# Patient Record
Sex: Female | Born: 1955 | Race: Black or African American | Hispanic: No | Marital: Single | Smoking: Current every day smoker
Health system: Southern US, Community
[De-identification: ages and names within clinical notes are randomized; demographics above are authoritative.]

## PROBLEM LIST (undated history)

## (undated) DIAGNOSIS — I10 Essential (primary) hypertension: Secondary | ICD-10-CM

## (undated) DIAGNOSIS — E119 Type 2 diabetes mellitus without complications: Secondary | ICD-10-CM

## (undated) DIAGNOSIS — G459 Transient cerebral ischemic attack, unspecified: Secondary | ICD-10-CM

## (undated) DIAGNOSIS — M1990 Unspecified osteoarthritis, unspecified site: Secondary | ICD-10-CM

## (undated) DIAGNOSIS — E785 Hyperlipidemia, unspecified: Secondary | ICD-10-CM

## (undated) DIAGNOSIS — M5020 Other cervical disc displacement, unspecified cervical region: Secondary | ICD-10-CM

## (undated) HISTORY — PX: TUBAL LIGATION: SHX77

## (undated) HISTORY — DX: Essential (primary) hypertension: I10

---

## 1998-08-14 DIAGNOSIS — G459 Transient cerebral ischemic attack, unspecified: Secondary | ICD-10-CM

## 1998-08-14 HISTORY — DX: Transient cerebral ischemic attack, unspecified: G45.9

## 2006-07-08 ENCOUNTER — Emergency Department: Payer: Self-pay | Admitting: Emergency Medicine

## 2008-02-26 ENCOUNTER — Other Ambulatory Visit: Payer: Self-pay

## 2008-02-26 ENCOUNTER — Emergency Department: Payer: Self-pay | Admitting: Unknown Physician Specialty

## 2008-07-28 ENCOUNTER — Emergency Department: Payer: Self-pay | Admitting: Emergency Medicine

## 2009-06-22 ENCOUNTER — Ambulatory Visit: Payer: Self-pay | Admitting: Internal Medicine

## 2011-02-06 ENCOUNTER — Ambulatory Visit: Payer: Self-pay | Admitting: "Endocrinology

## 2011-04-18 ENCOUNTER — Ambulatory Visit: Payer: Self-pay

## 2011-07-05 ENCOUNTER — Ambulatory Visit: Payer: Self-pay

## 2011-09-21 ENCOUNTER — Ambulatory Visit (INDEPENDENT_AMBULATORY_CARE_PROVIDER_SITE_OTHER): Payer: Self-pay | Admitting: Family Medicine

## 2011-09-21 ENCOUNTER — Telehealth: Payer: Self-pay | Admitting: *Deleted

## 2011-09-21 ENCOUNTER — Encounter: Payer: Self-pay | Admitting: Family Medicine

## 2011-09-21 VITALS — BP 133/82 | HR 84 | Ht 64.0 in | Wt 172.0 lb

## 2011-09-21 DIAGNOSIS — H00019 Hordeolum externum unspecified eye, unspecified eyelid: Secondary | ICD-10-CM

## 2011-09-21 DIAGNOSIS — I10 Essential (primary) hypertension: Secondary | ICD-10-CM | POA: Insufficient documentation

## 2011-09-21 DIAGNOSIS — E785 Hyperlipidemia, unspecified: Secondary | ICD-10-CM | POA: Insufficient documentation

## 2011-09-21 DIAGNOSIS — M542 Cervicalgia: Secondary | ICD-10-CM

## 2011-09-21 DIAGNOSIS — H029 Unspecified disorder of eyelid: Secondary | ICD-10-CM | POA: Insufficient documentation

## 2011-09-21 DIAGNOSIS — G8929 Other chronic pain: Secondary | ICD-10-CM | POA: Insufficient documentation

## 2011-09-21 MED ORDER — CYCLOBENZAPRINE HCL 10 MG PO TABS: 10.0000 mg | ORAL_TABLET | Freq: Three times a day (TID) | ORAL | Status: DC | PRN

## 2011-09-21 MED ORDER — NAPROXEN 500 MG PO TABS: 500.0000 mg | ORAL_TABLET | Freq: Two times a day (BID) | ORAL | Status: DC

## 2011-09-21 NOTE — Assessment & Plan Note (Signed)
It is unclear exactly how long this has been going on or why she came to [facility] to have it evaluated; She seems to have seen multiple doctors regarding the issues and had imaging performed and subsequent recommendation was surgery; - obtain records from [facility] and Dr. Iantha Fallen in [place] - continue cyclobenzaprine and motrin regimen - given refills but patient states that she uses a free prescription program in [place] - no imaging at this time since the problem and chronic and stable

## 2011-09-21 NOTE — Progress Notes (Signed)
  Subjective:    Patient ID: Alyssa Black, female    DOB: 05-Dec-1955, 56 y.o.   MRN: [id number]  HPI Alyssa Black is a new patient who presents with chronic neck pain and bilateral nodule on her upper eyelids.   1. 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; however she states that she has also been told that she has arthritis of her neck; Also she had at least one doctor recommend surgery, but she was hesitant to do so; When asked why she came for evaluation here as opposed to one of the other physicians who has already evaluated her, she does not give a direct answer, but states that she does not have much money; currently she states that the pain is in her central neck and lower back; it is a 6/10 and reaches a 10/10 three times a week with increased activity; it is a throbbing pain; her treatment regimen is cyclobenzaprine 10mg  TID, Naproxen 500 mg BID, topical analgesic that is OTC; she also wears a back brace at time; I'm not sure who is prescribing these medications, but states that the medication help; no numbness or tingling in hands  2. Eyelid Lesions - bilateral, upper eyelid, noticed a few months ago, seen by another physician who said to put warm cloth on them, not painful, no drainage  Review of Systems  Constitutional: Negative for fever, activity change and unexpected weight change.  HENT: Positive for neck pain and neck stiffness.   Eyes: Negative for pain.  Respiratory: Positive for chest tightness (took place while exercising, right side of chest when lifting a weight, felt muscle pull, relieved when exercise cesased, no assocaited nausea, vomiting, diaphoresis). Negative for shortness of breath.   Cardiovascular: Negative for chest pain.        Objective:   Physical Exam BP 133/82  Pulse 84  Ht 5\' 4"  (1.626 m)  Wt 172 lb (78.019 kg)  BMI 29.52 kg/m2 Gen: alert, oriented, pleasant, NAD HEENT: NCAT,  PERRLA Cardiac: RRR, no M,R,G; no carotid bruits Lungs: CTA-B MSK: exquisite tenderness to palpation along C7, and lumosacral spine,   Upper extermities: 5/5 strength of shoulder, elbow flexion bilaterally; 4/5 elbow extension bilaterally  Lower extremities: 4/5 hip flexion bilaterally; 4/5 knee extension and flexion Neuro: 2+ biceps, brachioradialis, and patellar DTR       Assessment & Plan:  Alyssa Black is a very nice patient who presents for evaluation of stable, chronic neck pain for which she has had a relatively thorough work-up in the past, but lacks records.

## 2011-09-21 NOTE — Assessment & Plan Note (Signed)
Bilateral upper eyelids, possible hordeolum vs. Epidermal inclusion cyst;  -told to use warm compress 10-15 minutes 4x a day;  -re-evaluate in 1 month

## 2011-09-21 NOTE — Patient Instructions (Signed)
Dear Alyssa Black,   Thank you for coming to clinic today. Please read below regarding the issues that we discussed.   1. Neck Pain - Continue current meds and I will try to get records from [facility].   2. Eye Nodule - I believe that this is a stye - which can be treated warm compresses 10-15 minutes 4x day.    Please follow up in clinic in 8 weeks . Please call earlier if you have any questions or concerns.   Sincerely,   Dr. Clinton Sawyer

## 2011-09-21 NOTE — Telephone Encounter (Signed)
Faxed ROI to #[phone] .Arlyss Repress

## 2011-09-28 ENCOUNTER — Telehealth: Payer: Self-pay | Admitting: *Deleted

## 2011-09-28 NOTE — Telephone Encounter (Signed)
Patient came to office today and requested a medicine for her joint pain. Wants the same medicine she was given in 1999  of which she still has 1.5 tabs left,   but   can't make out name on the bottle. States it is yellow and states JOINT on the bottle.  States she takes 1/2 tab just when she absolutely needs it .  She took one a week or so ago.  Firstly,  advised patient not to take anymore of this medication  as it is way past expiration date and may be harmful to her.  Also advised that she can discuss need for medication for joint pain when she returns for next appointment..  However she wants MD to be asked  now because she did discuss pains in neck and back with MD at her visit.  She takes naprosyn and does have an RX for this along with fllexeril  that she has not had filled yet that was given at visit.   Will forward message to Dr. Clinton Sawyer.Marland Kitchen

## 2011-10-02 NOTE — Telephone Encounter (Signed)
Message left on voicemail to call office for appointment to discuss .

## 2011-10-02 NOTE — Telephone Encounter (Signed)
I do not know which medication the patient is referring to. We covered her current medical regimen for pain during the visit on 2/7, and it included flexeril, naprosyn, and OTC topical analgesic. Given that she was stable on this regimen, it was continued. If she is having pain that is not well controlled on this regimen or is acutely worse, then we can discuss other options at an appointment. However, it is not possible to refill a medication that I have no record of and that he patient does not know as well. Also, I do not write prescriptions for new pain medications without an appointment. Thank you.

## 2012-04-02 ENCOUNTER — Emergency Department: Payer: Self-pay | Admitting: Emergency Medicine

## 2012-07-25 ENCOUNTER — Ambulatory Visit: Payer: Self-pay

## 2013-06-22 ENCOUNTER — Emergency Department: Payer: Self-pay | Admitting: Internal Medicine

## 2013-06-22 LAB — URINALYSIS, COMPLETE
Bilirubin,UR: NEGATIVE
Glucose,UR: NEGATIVE mg/dL (ref 0–75)
Ketone: NEGATIVE
Nitrite: NEGATIVE
Protein: 30
Transitional Epi: 1
WBC UR: 112 /HPF (ref 0–5)

## 2014-02-03 ENCOUNTER — Ambulatory Visit: Payer: Self-pay | Admitting: Internal Medicine

## 2015-01-20 ENCOUNTER — Other Ambulatory Visit: Payer: Self-pay

## 2015-01-27 ENCOUNTER — Ambulatory Visit: Payer: Self-pay | Admitting: Internal Medicine

## 2015-07-21 ENCOUNTER — Other Ambulatory Visit: Payer: Self-pay

## 2015-07-22 ENCOUNTER — Other Ambulatory Visit: Payer: Self-pay

## 2015-07-22 LAB — HEPATIC FUNCTION PANEL
ALK PHOS: 71 U/L (ref 25–125)
ALT: 20 U/L (ref 7–35)
AST: 15 U/L (ref 13–35)
Bilirubin, Total: 0.3 mg/dL

## 2015-07-22 LAB — LIPID PANEL
CHOLESTEROL: 194 mg/dL (ref 0–200)
HDL: 50 mg/dL (ref 35–70)
LDL CALC: 104 mg/dL
TRIGLYCERIDES: 199 mg/dL — AB (ref 40–160)

## 2015-07-22 LAB — CBC AND DIFFERENTIAL
HCT: 44 % (ref 36–46)
HEMOGLOBIN: 15.1 g/dL (ref 12.0–16.0)
NEUTROS ABS: 5 /uL
PLATELETS: 312 10*3/uL (ref 150–399)
WBC: 9.8 10*3/mL

## 2015-07-22 LAB — MICROALBUMIN, URINE: MICROALB UR: 3

## 2015-07-22 LAB — HEMOGLOBIN A1C: Hemoglobin A1C: 7

## 2015-07-28 ENCOUNTER — Ambulatory Visit: Payer: Self-pay | Admitting: Internal Medicine

## 2015-07-29 LAB — BASIC METABOLIC PANEL
BUN: 11 mg/dL (ref 4–21)
Creatinine: 0.8 mg/dL (ref <=1.1)
Glucose: 102 mg/dL
Potassium: 4.9 mmol/L (ref 3.4–5.3)
Sodium: 144 mmol/L (ref 137–147)

## 2015-07-29 LAB — TSH: TSH: 1.85 u[IU]/mL (ref <=5.90)

## 2015-08-03 ENCOUNTER — Other Ambulatory Visit: Payer: Self-pay

## 2015-08-18 ENCOUNTER — Ambulatory Visit: Payer: Self-pay | Admitting: Ophthalmology

## 2015-08-25 ENCOUNTER — Ambulatory Visit: Payer: Self-pay | Admitting: Ophthalmology

## 2015-11-03 ENCOUNTER — Ambulatory Visit: Payer: Self-pay

## 2015-11-03 DIAGNOSIS — E039 Hypothyroidism, unspecified: Secondary | ICD-10-CM

## 2015-11-03 DIAGNOSIS — E119 Type 2 diabetes mellitus without complications: Secondary | ICD-10-CM

## 2015-11-04 LAB — COMPREHENSIVE METABOLIC PANEL
A/G RATIO: 1.5 (ref 1.2–2.2)
ALT: 19 IU/L (ref 0–32)
AST: 17 IU/L (ref 0–40)
Albumin: 4.3 g/dL (ref 3.6–4.8)
Alkaline Phosphatase: 58 IU/L (ref 39–117)
BUN/Creatinine Ratio: 13 (ref 11–26)
BUN: 10 mg/dL (ref 8–27)
Bilirubin Total: 0.3 mg/dL (ref 0.0–1.2)
CALCIUM: 9.1 mg/dL (ref 8.7–10.3)
CHLORIDE: 102 mmol/L (ref 96–106)
CO2: 25 mmol/L (ref 18–29)
Creatinine, Ser: 0.78 mg/dL (ref 0.57–1.00)
GFR, EST AFRICAN AMERICAN: 96 mL/min/{1.73_m2} (ref >59)
GFR, EST NON AFRICAN AMERICAN: 83 mL/min/{1.73_m2} (ref >59)
GLUCOSE: 113 mg/dL — AB (ref 65–99)
Globulin, Total: 2.9 g/dL (ref 1.5–4.5)
POTASSIUM: 4.3 mmol/L (ref 3.5–5.2)
Sodium: 144 mmol/L (ref 134–144)
TOTAL PROTEIN: 7.2 g/dL (ref 6.0–8.5)

## 2015-11-04 LAB — TSH: TSH: 1.14 u[IU]/mL (ref 0.450–4.500)

## 2015-11-10 ENCOUNTER — Encounter: Payer: Self-pay | Admitting: Internal Medicine

## 2015-11-10 ENCOUNTER — Ambulatory Visit: Payer: Self-pay | Admitting: Internal Medicine

## 2015-11-10 VITALS — BP 137/85 | HR 78 | Temp 98.4°F | Wt 174.0 lb

## 2015-11-10 DIAGNOSIS — I10 Essential (primary) hypertension: Secondary | ICD-10-CM

## 2015-11-10 DIAGNOSIS — M1990 Unspecified osteoarthritis, unspecified site: Secondary | ICD-10-CM | POA: Insufficient documentation

## 2015-11-10 NOTE — Assessment & Plan Note (Signed)
At target.

## 2015-11-10 NOTE — Progress Notes (Signed)
   Subjective:    Patient ID: Alyssa Black, female    DOB: [birth year], 60 y.o.   MRN: [id number]  HPI  Patient Active Problem List   Diagnosis Date Noted  . Arthritis 11/10/2015  . Hypertension 09/21/2011  . Hyperlipidemia 09/21/2011  . Chronic neck pain 09/21/2011    Pt presents with a f/u for hypertension. Blood pressure was good this morning.   Pt uses OTC gel hot pouches for arthritis pain (Salonpas).    Last Met C labs shows sugars to be trivially elevated.     Review of Systems     Objective:   Physical Exam  Constitutional: She is oriented to person, place, and time.  Cardiovascular: Normal rate, regular rhythm and normal heart sounds.   Pulmonary/Chest: Effort normal and breath sounds normal.  Neurological: She is alert and oriented to person, place, and time.   BP 137/85 mmHg  Pulse 78  Temp(Src) 98.4 F (36.9 C)  Wt 174 lb (78.926 kg)    Medication List       This list is accurate as of: 11/10/15  9:31 AM.  Always use your most recent med list.               cetirizine 10 MG tablet  Commonly known as:  ZYRTEC  Take 10 mg by mouth 2 (two) times daily.     cyclobenzaprine 10 MG tablet  Commonly known as:  FLEXERIL  Take 1 tablet (10 mg total) by mouth 3 (three) times daily as needed.     hydrochlorothiazide 25 MG tablet  Commonly known as:  HYDRODIURIL  Take 25 mg by mouth daily.     loratadine 10 MG tablet  Commonly known as:  CLARITIN  Take 10 mg by mouth daily as needed for allergies.     naproxen 500 MG tablet  Commonly known as:  NAPROSYN  Take 1 tablet (500 mg total) by mouth 2 (two) times daily with a meal.     simvastatin 20 MG tablet  Commonly known as:  ZOCOR  Take 20 mg by mouth at bedtime.            Assessment & Plan:  Gave pt samples of Zyrtec.    Hypertension At target.  Calcium level repeat  wnl Blood sugars slightly elevated.   Return 3 months A1c, Met C, CBC, Lipid, Uric acid,

## 2016-01-11 ENCOUNTER — Other Ambulatory Visit: Payer: Self-pay | Admitting: Internal Medicine

## 2016-01-13 ENCOUNTER — Other Ambulatory Visit: Payer: Self-pay

## 2016-01-13 DIAGNOSIS — E785 Hyperlipidemia, unspecified: Secondary | ICD-10-CM

## 2016-01-13 DIAGNOSIS — I10 Essential (primary) hypertension: Secondary | ICD-10-CM

## 2016-01-13 MED ORDER — SIMVASTATIN 20 MG PO TABS: 20.0000 mg | ORAL_TABLET | Freq: Every day | ORAL | Status: AC

## 2016-01-13 MED ORDER — HYDROCHLOROTHIAZIDE 25 MG PO TABS: 25.0000 mg | ORAL_TABLET | Freq: Every day | ORAL | Status: AC

## 2016-02-02 ENCOUNTER — Other Ambulatory Visit: Payer: Self-pay

## 2016-02-02 DIAGNOSIS — I10 Essential (primary) hypertension: Secondary | ICD-10-CM

## 2016-02-03 LAB — COMPREHENSIVE METABOLIC PANEL
A/G RATIO: 1.6 (ref 1.2–2.2)
ALBUMIN: 4.6 g/dL (ref 3.6–4.8)
ALT: 21 IU/L (ref 0–32)
AST: 19 IU/L (ref 0–40)
Alkaline Phosphatase: 64 IU/L (ref 39–117)
BUN/Creatinine Ratio: 12 (ref 12–28)
BUN: 11 mg/dL (ref 8–27)
Bilirubin Total: 0.5 mg/dL (ref 0.0–1.2)
CALCIUM: 9.7 mg/dL (ref 8.7–10.3)
CO2: 25 mmol/L (ref 18–29)
CREATININE: 0.93 mg/dL (ref 0.57–1.00)
Chloride: 97 mmol/L (ref 96–106)
GFR, EST AFRICAN AMERICAN: 77 mL/min/{1.73_m2} (ref >59)
GFR, EST NON AFRICAN AMERICAN: 67 mL/min/{1.73_m2} (ref >59)
GLOBULIN, TOTAL: 2.9 g/dL (ref 1.5–4.5)
Glucose: 110 mg/dL — ABNORMAL HIGH (ref 65–99)
POTASSIUM: 3.9 mmol/L (ref 3.5–5.2)
SODIUM: 138 mmol/L (ref 134–144)
TOTAL PROTEIN: 7.5 g/dL (ref 6.0–8.5)

## 2016-02-03 LAB — CBC WITH DIFFERENTIAL/PLATELET
BASOS: 0 %
Basophils Absolute: 0 10*3/uL (ref 0.0–0.2)
EOS (ABSOLUTE): 0.1 10*3/uL (ref 0.0–0.4)
EOS: 1 %
HEMATOCRIT: 41.5 % (ref 34.0–46.6)
Hemoglobin: 13.8 g/dL (ref 11.1–15.9)
IMMATURE GRANS (ABS): 0 10*3/uL (ref 0.0–0.1)
Immature Granulocytes: 0 %
Lymphocytes Absolute: 2.6 10*3/uL (ref 0.7–3.1)
Lymphs: 30 %
MCH: 30.2 pg (ref 26.6–33.0)
MCHC: 33.3 g/dL (ref 31.5–35.7)
MCV: 91 fL (ref 79–97)
MONOS ABS: 0.5 10*3/uL (ref 0.1–0.9)
Monocytes: 6 %
NEUTROS ABS: 5.5 10*3/uL (ref 1.4–7.0)
Neutrophils: 63 %
PLATELETS: 290 10*3/uL (ref 150–379)
RBC: 4.57 x10E6/uL (ref 3.77–5.28)
RDW: 13 % (ref 12.3–15.4)
WBC: 8.7 10*3/uL (ref 3.4–10.8)

## 2016-02-03 LAB — LIPID PANEL
CHOL/HDL RATIO: 3.7 ratio (ref 0.0–4.4)
Cholesterol, Total: 176 mg/dL (ref 100–199)
HDL: 48 mg/dL (ref >39)
LDL Calculated: 104 mg/dL — ABNORMAL HIGH (ref 0–99)
Triglycerides: 121 mg/dL (ref 0–149)
VLDL CHOLESTEROL CAL: 24 mg/dL (ref 5–40)

## 2016-02-03 LAB — HEMOGLOBIN A1C
Est. average glucose Bld gHb Est-mCnc: 157 mg/dL
Hgb A1c MFr Bld: 7.1 % — ABNORMAL HIGH (ref 4.8–5.6)

## 2016-02-03 LAB — URIC ACID: URIC ACID: 7 mg/dL (ref 2.5–7.1)

## 2016-02-09 ENCOUNTER — Ambulatory Visit: Payer: Self-pay | Admitting: Internal Medicine

## 2016-02-09 ENCOUNTER — Encounter: Payer: Self-pay | Admitting: Internal Medicine

## 2016-02-09 VITALS — BP 133/88 | HR 77 | Temp 98.5°F | Wt 170.0 lb

## 2016-02-09 DIAGNOSIS — G8929 Other chronic pain: Secondary | ICD-10-CM

## 2016-02-09 DIAGNOSIS — M542 Cervicalgia: Principal | ICD-10-CM

## 2016-02-09 MED ORDER — CYCLOBENZAPRINE HCL 10 MG PO TABS: 10.0000 mg | ORAL_TABLET | Freq: Three times a day (TID) | ORAL | Status: DC | PRN

## 2016-02-09 MED ORDER — NAPROXEN 500 MG PO TABS: 500.0000 mg | ORAL_TABLET | ORAL | Status: DC | PRN

## 2016-02-09 NOTE — Progress Notes (Signed)
   Subjective:    Patient ID: Alyssa Black, female    DOB: 02/20/1956, 60 y.o.   MRN: [id number]  HPI  Patient presents today with hypertension and hyperlipidemia. Patient also has pain in upper and lower back.   Patient Active Problem List   Diagnosis Date Noted  . Arthritis 11/10/2015  . Hypertension 09/21/2011  . Hyperlipidemia 09/21/2011  . Chronic neck pain 09/21/2011      Medication List       This list is accurate as of: 02/09/16  9:35 AM.  Always use your most recent med list.               cetirizine 10 MG tablet  Commonly known as:  ZYRTEC  Take 10 mg by mouth 2 (two) times daily.     cyclobenzaprine 10 MG tablet  Commonly known as:  FLEXERIL  Take 1 tablet (10 mg total) by mouth 3 (three) times daily as needed.     hydrochlorothiazide 25 MG tablet  Commonly known as:  HYDRODIURIL  Take 1 tablet (25 mg total) by mouth daily.     loratadine 10 MG tablet  Commonly known as:  CLARITIN  Take 10 mg by mouth daily as needed for allergies.     naproxen 500 MG tablet  Commonly known as:  NAPROSYN  Take 1 tablet (500 mg total) by mouth 2 (two) times daily with a meal.     simvastatin 20 MG tablet  Commonly known as:  ZOCOR  Take 1 tablet (20 mg total) by mouth at bedtime.        Review of Systems     Objective:   Physical Exam  Constitutional: She is oriented to person, place, and time.  Cardiovascular: Normal rate and regular rhythm.   Pulmonary/Chest: Effort normal and breath sounds normal.  Neurological: She is alert and oriented to person, place, and time.     BP 133/88 mmHg  Pulse 77  Temp(Src) 98.5 F (36.9 C)  Wt 170 lb (77.111 kg)        Assessment & Plan:  Follow up in 3 months with a labs: MET C A1c, UA, and microalbumin  Patient should continue taking medications Naproxen should be taken as needed

## 2016-02-09 NOTE — Patient Instructions (Signed)
Follow up in 3 months with labs: MET C, A1c, UA, and microalbumin

## 2016-03-29 ENCOUNTER — Other Ambulatory Visit: Payer: Self-pay

## 2016-03-29 DIAGNOSIS — G8929 Other chronic pain: Secondary | ICD-10-CM

## 2016-03-29 DIAGNOSIS — M542 Cervicalgia: Principal | ICD-10-CM

## 2016-03-29 MED ORDER — NAPROXEN 500 MG PO TABS: 500.0000 mg | ORAL_TABLET | ORAL | 2 refills | Status: DC | PRN

## 2016-03-29 MED ORDER — CYCLOBENZAPRINE HCL 10 MG PO TABS: 10.0000 mg | ORAL_TABLET | Freq: Three times a day (TID) | ORAL | 2 refills | Status: DC | PRN

## 2016-05-17 ENCOUNTER — Other Ambulatory Visit: Payer: Self-pay

## 2016-05-17 DIAGNOSIS — M542 Cervicalgia: Principal | ICD-10-CM

## 2016-05-17 DIAGNOSIS — G8929 Other chronic pain: Secondary | ICD-10-CM

## 2016-05-18 LAB — COMPREHENSIVE METABOLIC PANEL
A/G RATIO: 1.4 (ref 1.2–2.2)
ALT: 30 IU/L (ref 0–32)
AST: 27 IU/L (ref 0–40)
Albumin: 4.2 g/dL (ref 3.6–4.8)
Alkaline Phosphatase: 64 IU/L (ref 39–117)
BUN/Creatinine Ratio: 8 — ABNORMAL LOW (ref 12–28)
BUN: 8 mg/dL (ref 8–27)
Bilirubin Total: 0.4 mg/dL (ref 0.0–1.2)
CALCIUM: 9.5 mg/dL (ref 8.7–10.3)
CO2: 27 mmol/L (ref 18–29)
Chloride: 96 mmol/L (ref 96–106)
Creatinine, Ser: 0.98 mg/dL (ref 0.57–1.00)
GFR, EST AFRICAN AMERICAN: 72 mL/min/{1.73_m2} (ref >59)
GFR, EST NON AFRICAN AMERICAN: 62 mL/min/{1.73_m2} (ref >59)
GLOBULIN, TOTAL: 3.1 g/dL (ref 1.5–4.5)
Glucose: 148 mg/dL — ABNORMAL HIGH (ref 65–99)
POTASSIUM: 3.4 mmol/L — AB (ref 3.5–5.2)
SODIUM: 142 mmol/L (ref 134–144)
TOTAL PROTEIN: 7.3 g/dL (ref 6.0–8.5)

## 2016-05-18 LAB — URINALYSIS
Bilirubin, UA: NEGATIVE
Glucose, UA: NEGATIVE
Ketones, UA: NEGATIVE
LEUKOCYTES UA: NEGATIVE
NITRITE UA: NEGATIVE
PH UA: 6 (ref 5.0–7.5)
Protein, UA: NEGATIVE
RBC, UA: NEGATIVE
Specific Gravity, UA: 1.016 (ref 1.005–1.030)
Urobilinogen, Ur: 0.2 mg/dL (ref 0.2–1.0)

## 2016-05-18 LAB — HEMOGLOBIN A1C
Est. average glucose Bld gHb Est-mCnc: 163 mg/dL
Hgb A1c MFr Bld: 7.3 % — ABNORMAL HIGH (ref 4.8–5.6)

## 2016-05-18 LAB — MICROALBUMIN / CREATININE URINE RATIO
Creatinine, Urine: 155.6 mg/dL
MICROALB/CREAT RATIO: 3.9 mg/g{creat} (ref 0.0–30.0)
Microalbumin, Urine: 6 ug/mL

## 2016-05-24 ENCOUNTER — Ambulatory Visit: Payer: Self-pay | Admitting: Internal Medicine

## 2016-05-24 ENCOUNTER — Encounter: Payer: Self-pay | Admitting: Internal Medicine

## 2016-05-24 VITALS — BP 149/88 | HR 72 | Temp 98.6°F | Wt 171.0 lb

## 2016-05-24 DIAGNOSIS — Z833 Family history of diabetes mellitus: Secondary | ICD-10-CM

## 2016-05-24 LAB — GLUCOSE, POCT (MANUAL RESULT ENTRY): POC Glucose: 147 mg/dl — AB (ref 70–99)

## 2016-05-24 NOTE — Progress Notes (Signed)
   Subjective:    Patient ID: Alyssa Black, female    DOB: [birth year], 60 y.o.   MRN: [id number]  HPI   Pt f/u for hypertension. BP is elevated at 149/88. A1C elevated to 7.3 from 7.1     Patient Active Problem List   Diagnosis Date Noted  . Arthritis 11/10/2015  . Hypertension 09/21/2011  . Hyperlipidemia 09/21/2011  . Chronic neck pain 09/21/2011     Medication List       Accurate as of 05/24/16  9:22 AM. Always use your most recent med list.          cetirizine 10 MG tablet Commonly known as:  ZYRTEC Take 10 mg by mouth 2 (two) times daily.   cyclobenzaprine 10 MG tablet Commonly known as:  FLEXERIL Take 1 tablet (10 mg total) by mouth 3 (three) times daily as needed.   hydrochlorothiazide 25 MG tablet Commonly known as:  HYDRODIURIL Take 1 tablet (25 mg total) by mouth daily.   loratadine 10 MG tablet Commonly known as:  CLARITIN Take 10 mg by mouth daily as needed for allergies.   naproxen 500 MG tablet Commonly known as:  NAPROSYN Take 1 tablet (500 mg total) by mouth as needed.   simvastatin 20 MG tablet Commonly known as:  ZOCOR Take 1 tablet (20 mg total) by mouth at bedtime.        Review of Systems     Objective:   Physical Exam  Constitutional: She is oriented to person, place, and time.  Cardiovascular: Normal rate, regular rhythm and normal heart sounds.   Pulmonary/Chest: Effort normal and breath sounds normal.  Neurological: She is alert and oriented to person, place, and time.    BP (!) 149/88   Pulse 72   Temp 98.6 F (37 C) (Oral)   Wt 171 lb (77.6 kg)   BMI 29.35 kg/m        Assessment & Plan:   F/u in 3 months w/ labs: Met C, CBC, Lipid, A1C

## 2016-05-24 NOTE — Patient Instructions (Signed)
F/u in 3 months w/ labs: Met C, CBC, Lipid, A1C

## 2016-07-18 DIAGNOSIS — M545 Low back pain, unspecified: Secondary | ICD-10-CM | POA: Insufficient documentation

## 2016-07-18 DIAGNOSIS — M5136 Other intervertebral disc degeneration, lumbar region: Secondary | ICD-10-CM | POA: Insufficient documentation

## 2016-07-18 DIAGNOSIS — M25569 Pain in unspecified knee: Secondary | ICD-10-CM | POA: Insufficient documentation

## 2016-07-18 DIAGNOSIS — M51369 Other intervertebral disc degeneration, lumbar region without mention of lumbar back pain or lower extremity pain: Secondary | ICD-10-CM | POA: Insufficient documentation

## 2016-07-19 ENCOUNTER — Encounter: Payer: Self-pay | Admitting: Internal Medicine

## 2016-07-19 ENCOUNTER — Ambulatory Visit: Payer: Self-pay | Admitting: Internal Medicine

## 2016-07-19 VITALS — BP 137/82 | HR 84 | Temp 98.1°F | Wt 168.0 lb

## 2016-07-19 DIAGNOSIS — Z833 Family history of diabetes mellitus: Secondary | ICD-10-CM

## 2016-07-19 DIAGNOSIS — Z0000 Encounter for general adult medical examination without abnormal findings: Secondary | ICD-10-CM

## 2016-07-19 LAB — GLUCOSE, POCT (MANUAL RESULT ENTRY): POC GLUCOSE: 210 mg/dL — AB (ref 70–99)

## 2016-07-19 NOTE — Progress Notes (Signed)
   Subjective:    Patient ID: Alyssa Black, female    DOB: 1955-10-22, 60 y.o.   MRN: [id number]  HPI BP 137/82   Pulse 84   Temp 98.1 F (36.7 C) (Oral)   Wt 168 lb (76.2 kg)   BMI 28.84 kg/m     Medication List       Accurate as of 07/19/16  9:36 AM. Always use your most recent med list.          cetirizine 10 MG tablet Commonly known as:  ZYRTEC Take 10 mg by mouth 2 (two) times daily.   cyclobenzaprine 10 MG tablet Commonly known as:  FLEXERIL Take 1 tablet (10 mg total) by mouth 3 (three) times daily as needed.   hydrochlorothiazide 25 MG tablet Commonly known as:  HYDRODIURIL Take 1 tablet (25 mg total) by mouth daily.   loratadine 10 MG tablet Commonly known as:  CLARITIN Take 10 mg by mouth daily as needed for allergies.   naproxen 500 MG tablet Commonly known as:  NAPROSYN Take 1 tablet (500 mg total) by mouth as needed.   simvastatin 20 MG tablet Commonly known as:  ZOCOR Take 1 tablet (20 mg total) by mouth at bedtime.        Pt c/o of knee pain. Had a shot yesterday in the knee; feeling better now.    Review of Systems     Objective:   Physical Exam  Constitutional: She is oriented to person, place, and time.  Cardiovascular: Normal rate, regular rhythm and normal heart sounds.   Pulmonary/Chest: Effort normal and breath sounds normal.  Neurological: She is alert and oriented to person, place, and time. She has normal reflexes.          Assessment & Plan:  Pt does not check sugars and does not have a meter.. Labs today met c, cbc, a1c, lipid, and tsh. Lungs clear and heart regular. Pt has received insurance.

## 2016-07-20 LAB — LIPID PANEL
CHOLESTEROL TOTAL: 169 mg/dL (ref 100–199)
Chol/HDL Ratio: 3.1 ratio units (ref 0.0–4.4)
HDL: 55 mg/dL (ref >39)
LDL Calculated: 100 mg/dL — ABNORMAL HIGH (ref 0–99)
Triglycerides: 72 mg/dL (ref 0–149)
VLDL Cholesterol Cal: 14 mg/dL (ref 5–40)

## 2016-07-20 LAB — CBC WITH DIFFERENTIAL
BASOS ABS: 0 10*3/uL (ref 0.0–0.2)
Basos: 0 %
EOS (ABSOLUTE): 0 10*3/uL (ref 0.0–0.4)
EOS: 0 %
Hematocrit: 40.5 % (ref 34.0–46.6)
Hemoglobin: 13.7 g/dL (ref 11.1–15.9)
IMMATURE GRANULOCYTES: 0 %
Immature Grans (Abs): 0 10*3/uL (ref 0.0–0.1)
Lymphocytes Absolute: 1.4 10*3/uL (ref 0.7–3.1)
Lymphs: 8 %
MCH: 30.2 pg (ref 26.6–33.0)
MCHC: 33.8 g/dL (ref 31.5–35.7)
MCV: 89 fL (ref 79–97)
MONOS ABS: 0.6 10*3/uL (ref 0.1–0.9)
Monocytes: 3 %
NEUTROS PCT: 89 %
Neutrophils Absolute: 15.3 10*3/uL — ABNORMAL HIGH (ref 1.4–7.0)
RBC: 4.54 x10E6/uL (ref 3.77–5.28)
RDW: 13.2 % (ref 12.3–15.4)
WBC: 17.3 10*3/uL — AB (ref 3.4–10.8)

## 2016-07-20 LAB — COMPREHENSIVE METABOLIC PANEL
A/G RATIO: 1.6 (ref 1.2–2.2)
ALBUMIN: 4.6 g/dL (ref 3.6–4.8)
ALK PHOS: 66 IU/L (ref 39–117)
ALT: 15 IU/L (ref 0–32)
AST: 13 IU/L (ref 0–40)
BILIRUBIN TOTAL: 0.3 mg/dL (ref 0.0–1.2)
BUN / CREAT RATIO: 13 (ref 12–28)
BUN: 11 mg/dL (ref 8–27)
CHLORIDE: 97 mmol/L (ref 96–106)
CO2: 26 mmol/L (ref 18–29)
Calcium: 10.1 mg/dL (ref 8.7–10.3)
Creatinine, Ser: 0.84 mg/dL (ref 0.57–1.00)
GFR calc Af Amer: 87 mL/min/{1.73_m2} (ref >59)
GFR calc non Af Amer: 75 mL/min/{1.73_m2} (ref >59)
GLUCOSE: 258 mg/dL — AB (ref 65–99)
Globulin, Total: 2.9 g/dL (ref 1.5–4.5)
POTASSIUM: 4.4 mmol/L (ref 3.5–5.2)
SODIUM: 140 mmol/L (ref 134–144)
Total Protein: 7.5 g/dL (ref 6.0–8.5)

## 2016-07-20 LAB — HEMOGLOBIN A1C
ESTIMATED AVERAGE GLUCOSE: 163 mg/dL
HEMOGLOBIN A1C: 7.3 % — AB (ref 4.8–5.6)

## 2016-07-20 LAB — TSH: TSH: 0.331 u[IU]/mL — AB (ref 0.450–4.500)

## 2016-08-23 ENCOUNTER — Ambulatory Visit: Payer: Self-pay | Admitting: Ophthalmology

## 2016-10-18 ENCOUNTER — Other Ambulatory Visit: Payer: Self-pay

## 2016-11-01 ENCOUNTER — Ambulatory Visit: Payer: Self-pay | Admitting: Internal Medicine

## 2016-11-23 ENCOUNTER — Other Ambulatory Visit: Payer: Self-pay | Admitting: Family Medicine

## 2016-11-23 DIAGNOSIS — Z1231 Encounter for screening mammogram for malignant neoplasm of breast: Secondary | ICD-10-CM

## 2016-11-24 ENCOUNTER — Telehealth: Payer: Self-pay

## 2016-11-24 ENCOUNTER — Other Ambulatory Visit: Payer: Self-pay

## 2016-11-24 DIAGNOSIS — Z1211 Encounter for screening for malignant neoplasm of colon: Secondary | ICD-10-CM

## 2016-11-24 NOTE — Telephone Encounter (Signed)
Gastroenterology Pre-Procedure Review  Request Date:  Requesting Physician: Dr.   PATIENT REVIEW QUESTIONS: The patient responded to the following health history questions as indicated:    1. Are you having any GI issues? no 2. Do you have a personal history of Polyps? no 3. Do you have a family history of Colon Cancer or Polyps? no 4. Diabetes Mellitus? no 5. Joint replacements in the past 12 months?no 6. Major health problems in the past 3 months?no 7. Any artificial heart valves, MVP, or defibrillator?no    MEDICATIONS & ALLERGIES:    Patient reports the following regarding taking any anticoagulation/antiplatelet therapy:   Plavix, Coumadin, Eliquis, Xarelto, Lovenox, Pradaxa, Brilinta, or Effient? no Aspirin? no  Patient confirms/reports the following medications:  Current Outpatient Prescriptions  Medication Sig Dispense Refill  . cetirizine (ZYRTEC) 10 MG tablet Take 10 mg by mouth 2 (two) times daily.    . cyclobenzaprine (FLEXERIL) 10 MG tablet Take 1 tablet (10 mg total) by mouth 3 (three) times daily as needed. 90 tablet 2  . hydrochlorothiazide (HYDRODIURIL) 25 MG tablet Take 1 tablet (25 mg total) by mouth daily. 90 tablet 3  . loratadine (CLARITIN) 10 MG tablet Take 10 mg by mouth daily as needed for allergies.    . naproxen (NAPROSYN) 500 MG tablet Take 1 tablet (500 mg total) by mouth as needed. 30 tablet 2  . simvastatin (ZOCOR) 20 MG tablet Take 1 tablet (20 mg total) by mouth at bedtime. 90 tablet 3   No current facility-administered medications for this visit.     Patient confirms/reports the following allergies:  Allergies  Allergen Reactions  . Pollen Extract Cough    No orders of the defined types were placed in this encounter.   AUTHORIZATION INFORMATION Primary Insurance: 1D#: Group #:  Secondary Insurance: 1D#: Group #:  SCHEDULE INFORMATION: Date: 12/05/16 Time: Location: [facility]

## 2016-11-28 ENCOUNTER — Telehealth: Payer: Self-pay | Admitting: Gastroenterology

## 2016-11-28 NOTE — Telephone Encounter (Signed)
11/28/16 Spoke with Juliann Pulse at [facility] and NO authorization required for Screening Colonoscopy [phone] / Z12.11 (Ref # [id number])

## 2016-12-04 ENCOUNTER — Encounter: Payer: Self-pay | Admitting: *Deleted

## 2016-12-05 ENCOUNTER — Ambulatory Visit: Admission: RE | Admit: 2016-12-05 | Payer: Medicare HMO | Source: Ambulatory Visit | Admitting: Gastroenterology

## 2016-12-05 ENCOUNTER — Encounter: Admission: RE | Payer: Self-pay | Source: Ambulatory Visit

## 2016-12-05 SURGERY — COLONOSCOPY WITH PROPOFOL
Anesthesia: General

## 2016-12-06 ENCOUNTER — Other Ambulatory Visit: Payer: Self-pay

## 2016-12-06 ENCOUNTER — Encounter: Payer: Self-pay | Admitting: *Deleted

## 2016-12-06 ENCOUNTER — Telehealth: Payer: Self-pay | Admitting: Gastroenterology

## 2016-12-06 DIAGNOSIS — Z1211 Encounter for screening for malignant neoplasm of colon: Secondary | ICD-10-CM

## 2016-12-06 NOTE — Telephone Encounter (Signed)
Patient needs to reschedule her colonoscopy. She stated she ate the morning of her last one and didn't use the prep  [phone]

## 2016-12-06 NOTE — Telephone Encounter (Signed)
Pts colonoscopy has been resceheduled to April 30 [place].

## 2016-12-06 NOTE — Telephone Encounter (Signed)
Patient is returning a phone call regarding a colonoscopy

## 2016-12-08 NOTE — Discharge Instructions (Signed)

## 2016-12-11 ENCOUNTER — Encounter
Admission: RE | Disposition: A | Discharge status: Home / Self Care | Payer: Self-pay | Source: Ambulatory Visit | Attending: Gastroenterology

## 2016-12-11 ENCOUNTER — Ambulatory Visit
Admission: RE | Admit: 2016-12-11 | Discharge: 2016-12-11 | Disposition: A | Discharge status: Home / Self Care | Payer: Medicare HMO | Source: Ambulatory Visit | Attending: Gastroenterology | Admitting: Gastroenterology

## 2016-12-11 ENCOUNTER — Ambulatory Visit: Payer: Medicare HMO | Admitting: Anesthesiology

## 2016-12-11 DIAGNOSIS — Z1211 Encounter for screening for malignant neoplasm of colon: Secondary | ICD-10-CM | POA: Diagnosis not present

## 2016-12-11 DIAGNOSIS — D124 Benign neoplasm of descending colon: Secondary | ICD-10-CM | POA: Insufficient documentation

## 2016-12-11 DIAGNOSIS — Z79899 Other long term (current) drug therapy: Secondary | ICD-10-CM | POA: Diagnosis not present

## 2016-12-11 DIAGNOSIS — K5730 Diverticulosis of large intestine without perforation or abscess without bleeding: Secondary | ICD-10-CM | POA: Insufficient documentation

## 2016-12-11 DIAGNOSIS — I10 Essential (primary) hypertension: Secondary | ICD-10-CM | POA: Insufficient documentation

## 2016-12-11 DIAGNOSIS — K635 Polyp of colon: Secondary | ICD-10-CM

## 2016-12-11 DIAGNOSIS — E785 Hyperlipidemia, unspecified: Secondary | ICD-10-CM | POA: Insufficient documentation

## 2016-12-11 DIAGNOSIS — M1990 Unspecified osteoarthritis, unspecified site: Secondary | ICD-10-CM | POA: Insufficient documentation

## 2016-12-11 DIAGNOSIS — Z8673 Personal history of transient ischemic attack (TIA), and cerebral infarction without residual deficits: Secondary | ICD-10-CM | POA: Diagnosis not present

## 2016-12-11 DIAGNOSIS — K640 First degree hemorrhoids: Secondary | ICD-10-CM | POA: Insufficient documentation

## 2016-12-11 DIAGNOSIS — D125 Benign neoplasm of sigmoid colon: Secondary | ICD-10-CM | POA: Insufficient documentation

## 2016-12-11 DIAGNOSIS — F17210 Nicotine dependence, cigarettes, uncomplicated: Secondary | ICD-10-CM | POA: Insufficient documentation

## 2016-12-11 DIAGNOSIS — D122 Benign neoplasm of ascending colon: Secondary | ICD-10-CM

## 2016-12-11 HISTORY — DX: Other cervical disc displacement, unspecified cervical region: M50.20

## 2016-12-11 HISTORY — DX: Unspecified osteoarthritis, unspecified site: M19.90

## 2016-12-11 HISTORY — PX: POLYPECTOMY: SHX5525

## 2016-12-11 HISTORY — DX: Hyperlipidemia, unspecified: E78.5

## 2016-12-11 HISTORY — PX: COLONOSCOPY WITH PROPOFOL: SHX5780

## 2016-12-11 HISTORY — DX: Transient cerebral ischemic attack, unspecified: G45.9

## 2016-12-11 SURGERY — COLONOSCOPY WITH PROPOFOL
Anesthesia: Monitor Anesthesia Care | Wound class: Contaminated

## 2016-12-11 MED ORDER — LIDOCAINE HCL (CARDIAC) 20 MG/ML IV SOLN
INTRAVENOUS | Status: DC | PRN
  Administered 2016-12-11: 50 mg via INTRAVENOUS

## 2016-12-11 MED ORDER — LACTATED RINGERS IV SOLN
INTRAVENOUS | Status: DC | PRN
  Administered 2016-12-11: 11:00:00 via INTRAVENOUS

## 2016-12-11 MED ORDER — PROPOFOL 10 MG/ML IV BOLUS
INTRAVENOUS | Status: DC | PRN
  Administered 2016-12-11 (×3): 20 mg via INTRAVENOUS
  Administered 2016-12-11: 80 mg via INTRAVENOUS
  Administered 2016-12-11: 10 mg via INTRAVENOUS
  Administered 2016-12-11 (×2): 20 mg via INTRAVENOUS
  Administered 2016-12-11 (×2): 10 mg via INTRAVENOUS
  Administered 2016-12-11 (×2): 20 mg via INTRAVENOUS

## 2016-12-11 MED ORDER — STERILE WATER FOR IRRIGATION IR SOLN
Status: DC | PRN
  Administered 2016-12-11: 11:00:00

## 2016-12-11 SURGICAL SUPPLY — 23 items

## 2016-12-11 NOTE — Transfer of Care (Signed)
Immediate Anesthesia Transfer of Care Note  Patient: Alyssa Black  Procedure(s) Performed: Procedure(s): COLONOSCOPY WITH PROPOFOL (N/A) POLYPECTOMY  Patient Location: PACU  Anesthesia Type: MAC  Level of Consciousness: awake, alert  and patient cooperative  Airway and Oxygen Therapy: Patient Spontanous Breathing and Patient connected to supplemental oxygen  Post-op Assessment: Post-op Vital signs reviewed, Patient's Cardiovascular Status Stable, Respiratory Function Stable, Patent Airway and No signs of Nausea or vomiting  Post-op Vital Signs: Reviewed and stable  Complications: No apparent anesthesia complications

## 2016-12-11 NOTE — Anesthesia Procedure Notes (Signed)
Procedure Name: MAC Performed by: Mayme Genta Pre-anesthesia Checklist: Patient identified, Emergency Drugs available, Suction available, Timeout performed and Patient being monitored Patient Re-evaluated:Patient Re-evaluated prior to inductionOxygen Delivery Method: Nasal cannula Placement Confirmation: positive ETCO2

## 2016-12-11 NOTE — Anesthesia Preprocedure Evaluation (Addendum)
Anesthesia Evaluation  Patient identified by MRN, date of birth, ID band Patient awake    Airway Mallampati: II  TM Distance: >3 FB Neck ROM: Full    Dental no notable dental hx.    Pulmonary Current Smoker,    Pulmonary exam normal breath sounds clear to auscultation       Cardiovascular hypertension, Normal cardiovascular exam Rhythm:Regular Rate:Normal     Neuro/Psych negative neurological ROS  negative psych ROS   GI/Hepatic negative GI ROS, Neg liver ROS,   Endo/Other  negative endocrine ROS  Renal/GU negative Renal ROS  negative genitourinary   Musculoskeletal  (+) Arthritis ,   Abdominal Normal abdominal exam  (+)  Abdomen: soft.    Peds  Hematology negative hematology ROS (+)   Anesthesia Other Findings   Reproductive/Obstetrics negative OB ROS                             Anesthesia Physical Anesthesia Plan  ASA: II  Anesthesia Plan: MAC   Post-op Pain Management:    Induction:   Airway Management Planned: Mask  Additional Equipment: None  Intra-op Plan:   Post-operative Plan:   Informed Consent: I have reviewed the patients History and Physical, chart, labs and discussed the procedure including the risks, benefits and alternatives for the proposed anesthesia with the patient or authorized representative who has indicated his/her understanding and acceptance.     Plan Discussed with: CRNA, Anesthesiologist and Surgeon  Anesthesia Plan Comments:        Anesthesia Quick Evaluation

## 2016-12-11 NOTE — H&P (Signed)
Lucilla Lame, MD [address] [place], [place] [address] Phone: [phone] Fax : [phone]  Primary Care Physician:  [facility] Primary Gastroenterologist:  Dr. Allen Norris  Pre-Procedure History & Physical: HPI:  Alyssa Black is a 61 y.o. female is here for a screening colonoscopy.   Past Medical History:  Diagnosis Date  . Arthritis    right leg  . Hyperlipidemia   . Hypertension   . 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 TIA (transient ischemic attack) 2000   no deficits    Past Surgical History:  Procedure Laterality Date  . TUBAL LIGATION      Prior to Admission medications   Medication Sig Start Date End Date Taking? Authorizing Provider  cetirizine (ZYRTEC) 10 MG tablet Take 10 mg by mouth 2 (two) times daily.   Yes Historical Provider, MD  cyclobenzaprine (FLEXERIL) 10 MG tablet Take 1 tablet (10 mg total) by mouth 3 (three) times daily as needed. 03/29/16  Yes Tawni Millers, MD  hydrochlorothiazide (HYDRODIURIL) 25 MG tablet Take 1 tablet (25 mg total) by mouth daily. 01/13/16  Yes Shannon A McGowan, PA-C  naproxen (NAPROSYN) 500 MG tablet Take 1 tablet (500 mg total) by mouth as needed. 03/29/16  Yes Tawni Millers, MD  simvastatin (ZOCOR) 20 MG tablet Take 1 tablet (20 mg total) by mouth at bedtime. 01/13/16  Yes Shannon A McGowan, PA-C  diphenhydrAMINE (BENADRYL) 25 MG tablet Take 25 mg by mouth every 6 (six) hours as needed.    Historical Provider, MD    Allergies as of 12/06/2016 - Review Complete 12/06/2016  Allergen Reaction Noted  . Pollen extract Cough 05/24/2016    Family History  Problem Relation Age of Onset  . Diabetes type II Mother   . Diabetes type II Father   . Gout Father     Social History   Social History  . Marital status: Single    Spouse name: N/A  . Number of children: N/A  . Years of education: N/A   Occupational History  . Not on file.   Social History Main Topics  . Smoking status: Current  Every Day Smoker    Packs/day: 0.05    Years: 39.00    Types: E-cigarettes, Cigarettes  . Smokeless tobacco: Never Used     Comment: started age 41  . Alcohol use 0.0 oz/week     Comment: wine on occasion  . Drug use: Unknown  . Sexual activity: Not on file   Other Topics Concern  . Not on file   Social History Narrative  . No narrative on file    Review of Systems: See HPI, otherwise negative ROS  Physical Exam: BP (!) 150/79   Pulse 74   Temp 97.7 F (36.5 C) (Temporal)   Resp 16   Ht 5\' 4"  (1.626 m)   Wt 172 lb (78 kg)   SpO2 99%   BMI 29.52 kg/m  General:   Alert,  pleasant and cooperative in NAD Head:  Normocephalic and atraumatic. Neck:  Supple; no masses or thyromegaly. Lungs:  Clear throughout to auscultation.    Heart:  Regular rate and rhythm. Abdomen:  Soft, nontender and nondistended. Normal bowel sounds, without guarding, and without rebound.   Neurologic:  Alert and  oriented x4;  grossly normal neurologically.  Impression/Plan: Alyssa Black is now here to undergo a screening colonoscopy.  Risks, benefits, and alternatives regarding colonoscopy have been reviewed  with the patient.  Questions have been answered.  All parties agreeable.

## 2016-12-11 NOTE — Anesthesia Postprocedure Evaluation (Signed)
Anesthesia Post Note  Patient: Alyssa Black  Procedure(s) Performed: Procedure(s) (LRB): COLONOSCOPY WITH PROPOFOL (N/A) POLYPECTOMY  Patient location during evaluation: PACU Anesthesia Type: MAC Level of consciousness: awake Pain management: pain level controlled Vital Signs Assessment: post-procedure vital signs reviewed and stable Respiratory status: spontaneous breathing Cardiovascular status: blood pressure returned to baseline Postop Assessment: no headache Anesthetic complications: no    Lavonna Monarch

## 2016-12-11 NOTE — Op Note (Signed)
[facility] Gastroenterology Patient Name: Alyssa Black Procedure Date: 12/11/2016 10:46 AM MRN: [id number] Account #: [id number] Date of Birth: 1955/12/28 Admit Type: Outpatient Age: 61 Room: [facility] OR ROOM 01 Gender: Female Note Status: Finalized Procedure:            Colonoscopy Indications:          Screening for colorectal malignant neoplasm Providers:            Lucilla Lame MD, MD Referring MD:         [facility] (Referring MD) Medicines:            Propofol per Anesthesia Complications:        No immediate complications. Procedure:            Pre-Anesthesia Assessment:                       - Prior to the procedure, a History and Physical was                        performed, and patient medications and allergies were                        reviewed. The patient's tolerance of previous                        anesthesia was also reviewed. The risks and benefits of                        the procedure and the sedation options and risks were                        discussed with the patient. All questions were                        answered, and informed consent was obtained. Prior                        Anticoagulants: The patient has taken no previous                        anticoagulant or antiplatelet agents. ASA Grade                        Assessment: II - A patient with mild systemic disease.                        After reviewing the risks and benefits, the patient was                        deemed in satisfactory condition to undergo the                        procedure.                       After obtaining informed consent, the colonoscope was                        passed under direct vision. Throughout the procedure,  the patient's blood pressure, pulse, and oxygen                        saturations were monitored continuously. The Olympus                        Colonoscope [id number]) was introduced  through the                        anus and advanced to the the cecum, identified by                        appendiceal orifice and ileocecal valve. The                        colonoscopy was performed without difficulty. The                        patient tolerated the procedure well. The quality of                        the bowel preparation was excellent. Findings:      The perianal and digital rectal examinations were normal.      A 4 mm polyp was found in the ascending colon. The polyp was sessile.       The polyp was removed with a cold snare. Resection and retrieval were       complete.      Two sessile polyps were found in the descending colon. The polyps were 4       to 5 mm in size. These polyps were removed with a cold snare. Resection       and retrieval were complete.      Four sessile polyps were found in the sigmoid colon. The polyps were 5       to 6 mm in size. These polyps were removed with a cold snare. Resection       and retrieval were complete.      A few small-mouthed diverticula were found in the sigmoid colon.      Non-bleeding internal hemorrhoids were found during retroflexion. The       hemorrhoids were Grade I (internal hemorrhoids that do not prolapse). Impression:           - One 4 mm polyp in the ascending colon, removed with a                        cold snare. Resected and retrieved.                       - Two 4 to 5 mm polyps in the descending colon, removed                        with a cold snare. Resected and retrieved.                       - Four 5 to 6 mm polyps in the sigmoid colon, removed                        with a cold snare. Resected and retrieved.                       -  Diverticulosis in the sigmoid colon.                       - Non-bleeding internal hemorrhoids. Recommendation:       - Discharge patient to home.                       - Resume previous diet.                       - Continue present medications.                        - Await pathology results.                       - Repeat colonoscopy in 5 years if polyp adenoma and 10                        years if hyperplastic Procedure Code(s):    --- Professional ---                       [phone], Colonoscopy, flexible; with removal of tumor(s),                        polyp(s), or other lesion(s) by snare technique Diagnosis Code(s):    --- Professional ---                       Z12.11, Encounter for screening for malignant neoplasm                        of colon                       D12.2, Benign neoplasm of ascending colon                       D12.5, Benign neoplasm of sigmoid colon                       D12.4, Benign neoplasm of descending colon CPT copyright 2016 American Medical Association. All rights reserved. The codes documented in this report are preliminary and upon coder review may  be revised to meet current compliance requirements. Lucilla Lame MD, MD 12/11/2016 11:22:59 AM This report has been signed electronically. Number of Addenda: 0 Note Initiated On: 12/11/2016 10:46 AM Scope Withdrawal Time: 0 hours 8 minutes 47 seconds  Total Procedure Duration: 0 hours 15 minutes 37 seconds       [facility]

## 2016-12-12 ENCOUNTER — Encounter: Payer: Self-pay | Admitting: Gastroenterology

## 2016-12-13 ENCOUNTER — Encounter: Payer: Self-pay | Admitting: Gastroenterology

## 2016-12-14 ENCOUNTER — Encounter: Payer: Self-pay | Admitting: Gastroenterology

## 2016-12-19 ENCOUNTER — Ambulatory Visit
Admission: RE | Admit: 2016-12-19 | Discharge: 2016-12-19 | Disposition: A | Discharge status: Home / Self Care | Payer: Medicare HMO | Source: Ambulatory Visit | Attending: Family Medicine | Admitting: Family Medicine

## 2016-12-19 DIAGNOSIS — Z1231 Encounter for screening mammogram for malignant neoplasm of breast: Secondary | ICD-10-CM | POA: Insufficient documentation

## 2019-05-07 ENCOUNTER — Other Ambulatory Visit: Payer: Self-pay | Admitting: Family Medicine

## 2019-05-07 DIAGNOSIS — Z1231 Encounter for screening mammogram for malignant neoplasm of breast: Secondary | ICD-10-CM

## 2019-10-03 ENCOUNTER — Other Ambulatory Visit: Payer: Self-pay | Admitting: Family Medicine

## 2019-10-03 DIAGNOSIS — Z1231 Encounter for screening mammogram for malignant neoplasm of breast: Secondary | ICD-10-CM

## 2020-01-30 ENCOUNTER — Ambulatory Visit
Admission: RE | Admit: 2020-01-30 | Discharge: 2020-01-30 | Disposition: A | Discharge status: Home / Self Care | Payer: Medicare HMO | Source: Ambulatory Visit | Attending: Family Medicine | Admitting: Family Medicine

## 2020-01-30 DIAGNOSIS — Z1231 Encounter for screening mammogram for malignant neoplasm of breast: Secondary | ICD-10-CM | POA: Insufficient documentation

## 2020-12-22 ENCOUNTER — Other Ambulatory Visit: Payer: Self-pay | Admitting: Family Medicine

## 2021-01-27 ENCOUNTER — Other Ambulatory Visit: Payer: Self-pay | Admitting: Family Medicine

## 2021-01-27 DIAGNOSIS — Z1231 Encounter for screening mammogram for malignant neoplasm of breast: Secondary | ICD-10-CM

## 2021-02-01 ENCOUNTER — Other Ambulatory Visit: Payer: Self-pay

## 2021-02-01 ENCOUNTER — Ambulatory Visit
Admission: RE | Admit: 2021-02-01 | Discharge: 2021-02-01 | Disposition: A | Discharge status: Home / Self Care | Payer: Medicare Other | Source: Ambulatory Visit | Attending: Family Medicine | Admitting: Family Medicine

## 2021-02-01 DIAGNOSIS — Z1231 Encounter for screening mammogram for malignant neoplasm of breast: Secondary | ICD-10-CM | POA: Insufficient documentation

## 2021-04-25 ENCOUNTER — Other Ambulatory Visit: Payer: Medicare Other

## 2021-05-10 ENCOUNTER — Other Ambulatory Visit: Payer: Self-pay

## 2021-05-10 ENCOUNTER — Ambulatory Visit (LOCAL_COMMUNITY_HEALTH_CENTER): Payer: Self-pay

## 2021-05-10 DIAGNOSIS — Z111 Encounter for screening for respiratory tuberculosis: Secondary | ICD-10-CM

## 2021-05-13 ENCOUNTER — Other Ambulatory Visit: Payer: Self-pay

## 2021-05-13 ENCOUNTER — Ambulatory Visit (LOCAL_COMMUNITY_HEALTH_CENTER): Payer: Medicare Other

## 2021-05-13 DIAGNOSIS — Z111 Encounter for screening for respiratory tuberculosis: Secondary | ICD-10-CM

## 2021-05-13 LAB — TB SKIN TEST
Induration: 0 mm
TB Skin Test: NEGATIVE

## 2021-09-05 ENCOUNTER — Other Ambulatory Visit: Payer: Self-pay

## 2021-09-05 DIAGNOSIS — Z86010 Personal history of colonic polyps: Secondary | ICD-10-CM

## 2021-09-05 MED ORDER — NA SULFATE-K SULFATE-MG SULF 17.5-3.13-1.6 GM/177ML PO SOLN: 1.0000 | Freq: Once | ORAL | 0 refills | Status: AC

## 2021-11-17 ENCOUNTER — Other Ambulatory Visit: Payer: Self-pay | Admitting: Family Medicine

## 2021-11-17 DIAGNOSIS — Z1231 Encounter for screening mammogram for malignant neoplasm of breast: Secondary | ICD-10-CM

## 2021-12-20 ENCOUNTER — Encounter
Admission: RE | Disposition: A | Discharge status: Home / Self Care | Payer: Self-pay | Source: Home / Self Care | Attending: Gastroenterology

## 2021-12-20 ENCOUNTER — Ambulatory Visit: Payer: Medicare Other | Admitting: Anesthesiology

## 2021-12-20 ENCOUNTER — Ambulatory Visit
Admission: RE | Admit: 2021-12-20 | Discharge: 2021-12-20 | Disposition: A | Discharge status: Home / Self Care | Payer: Medicare Other | Attending: Gastroenterology | Admitting: Gastroenterology

## 2021-12-20 ENCOUNTER — Encounter: Payer: Self-pay | Admitting: Gastroenterology

## 2021-12-20 DIAGNOSIS — Z7984 Long term (current) use of oral hypoglycemic drugs: Secondary | ICD-10-CM | POA: Insufficient documentation

## 2021-12-20 DIAGNOSIS — Z860100 Personal history of colon polyps, unspecified: Secondary | ICD-10-CM

## 2021-12-20 DIAGNOSIS — I10 Essential (primary) hypertension: Secondary | ICD-10-CM | POA: Insufficient documentation

## 2021-12-20 DIAGNOSIS — E119 Type 2 diabetes mellitus without complications: Secondary | ICD-10-CM | POA: Insufficient documentation

## 2021-12-20 DIAGNOSIS — Z1211 Encounter for screening for malignant neoplasm of colon: Secondary | ICD-10-CM | POA: Insufficient documentation

## 2021-12-20 DIAGNOSIS — F17210 Nicotine dependence, cigarettes, uncomplicated: Secondary | ICD-10-CM | POA: Insufficient documentation

## 2021-12-20 DIAGNOSIS — D120 Benign neoplasm of cecum: Secondary | ICD-10-CM | POA: Diagnosis not present

## 2021-12-20 DIAGNOSIS — K635 Polyp of colon: Secondary | ICD-10-CM | POA: Insufficient documentation

## 2021-12-20 DIAGNOSIS — D122 Benign neoplasm of ascending colon: Secondary | ICD-10-CM | POA: Insufficient documentation

## 2021-12-20 DIAGNOSIS — K5730 Diverticulosis of large intestine without perforation or abscess without bleeding: Secondary | ICD-10-CM | POA: Diagnosis not present

## 2021-12-20 DIAGNOSIS — K641 Second degree hemorrhoids: Secondary | ICD-10-CM | POA: Insufficient documentation

## 2021-12-20 HISTORY — PX: COLONOSCOPY WITH PROPOFOL: SHX5780

## 2021-12-20 HISTORY — DX: Type 2 diabetes mellitus without complications: E11.9

## 2021-12-20 LAB — GLUCOSE, CAPILLARY: Glucose-Capillary: 133 mg/dL — ABNORMAL HIGH (ref 70–99)

## 2021-12-20 SURGERY — COLONOSCOPY WITH PROPOFOL
Anesthesia: General

## 2021-12-20 MED ORDER — LIDOCAINE HCL (CARDIAC) PF 100 MG/5ML IV SOSY
PREFILLED_SYRINGE | INTRAVENOUS | Status: DC | PRN
  Administered 2021-12-20: 100 mg via INTRAVENOUS

## 2021-12-20 MED ORDER — SODIUM CHLORIDE 0.9 % IV SOLN: INTRAVENOUS | Status: DC

## 2021-12-20 MED ORDER — EPHEDRINE 5 MG/ML INJ
INTRAVENOUS | Status: AC
Start: 2021-12-20 — End: ?
  Filled 2021-12-20: qty 5

## 2021-12-20 MED ORDER — PROPOFOL 10 MG/ML IV BOLUS
INTRAVENOUS | Status: AC
  Filled 2021-12-20: qty 40

## 2021-12-20 MED ORDER — LIDOCAINE HCL (PF) 2 % IJ SOLN
INTRAMUSCULAR | Status: AC
  Filled 2021-12-20: qty 5

## 2021-12-20 MED ORDER — PROPOFOL 10 MG/ML IV BOLUS
INTRAVENOUS | Status: DC | PRN
  Administered 2021-12-20: 120 mg via INTRAVENOUS
  Administered 2021-12-20: 140 ug/kg/min via INTRAVENOUS

## 2021-12-20 NOTE — Transfer of Care (Signed)
Immediate Anesthesia Transfer of Care Note ? ?Patient: Alyssa Black ? ?Procedure(s) Performed: COLONOSCOPY WITH PROPOFOL ? ?Patient Location: Endo recovery  ? ?Anesthesia Type:General ? ?Level of Consciousness: drowsy ? ?Airway & Oxygen Therapy: Patient Spontanous Breathing and Patient connected to nasal cannula oxygen ? ?Post-op Assessment: Report given to RN and Post -op Vital signs reviewed and stable ? ?Post vital signs: Reviewed and stable ? ?Last Vitals:  ?Vitals Value Taken Time  ?BP 97/46 12/20/21 0945  ?Temp 35.9 0945  ?Pulse 74 12/20/21 0945  ?Resp 20 12/20/21 0945  ?SpO2 98 % 12/20/21 0945  ?Vitals shown include unvalidated device data. ? ?Last Pain:  ?Vitals:  ? 12/20/21 0827  ?TempSrc: Temporal  ?   ? ?  ? ?Complications: No notable events documented. ?

## 2021-12-20 NOTE — H&P (Signed)
? ?Alyssa Lame, MD [facility] ?[address] ?[place], [place] [address] ?Phone:[phone] ?Fax : [phone] ? ?Primary Care Physician:  [facility] [place] ?Primary Gastroenterologist:  Dr. Allen Norris ? ?Pre-Procedure History & Physical: ?HPI:  Alyssa Black is a 66 y.o. female is here for an colonoscopy. ?  ?Past Medical History:  ?Diagnosis Date  ? Arthritis   ? right leg  ? Diabetes mellitus without complication ([place])   ? Hyperlipidemia   ? Hypertension   ? 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 and lower back  ? TIA (transient ischemic attack) 2000  ? no deficits  ? ? ?Past Surgical History:  ?Procedure Laterality Date  ? COLONOSCOPY WITH PROPOFOL N/A 12/11/2016  ? Procedure: COLONOSCOPY WITH PROPOFOL;  Surgeon: Alyssa Lame, MD;  Location: [place];  Service: Endoscopy;  Laterality: N/A;  ? POLYPECTOMY  12/11/2016  ? Procedure: POLYPECTOMY;  Surgeon: Alyssa Lame, MD;  Location: [place];  Service: Endoscopy;;  ? TUBAL LIGATION    ? ? ?Prior to Admission medications   ?Medication Sig Start Date End Date Taking? Authorizing Provider  ?cetirizine (ZYRTEC) 10 MG tablet Take 10 mg by mouth 2 (two) times daily.   Yes [provider]  ?cyclobenzaprine (FLEXERIL) 10 MG tablet Take 1 tablet (10 mg total) by mouth 3 (three) times daily as needed. 03/29/16  Yes Tawni Millers, MD  ?diphenhydrAMINE (BENADRYL) 25 MG tablet Take 25 mg by mouth every 6 (six) hours as needed.   Yes [provider]  ?hydrochlorothiazide (HYDRODIURIL) 25 MG tablet Take 1 tablet (25 mg total) by mouth daily. 01/13/16  Yes McGowan, Larene [place] A, PA-C  ?ibuprofen (ADVIL) 800 MG tablet Take 800 mg by mouth every 8 (eight) hours as needed. 04/25/21  Yes [provider]  ?lisinopril (ZESTRIL) 2.5 MG tablet Take 2.5 mg by mouth daily. 04/09/21  Yes [provider]  ?simvastatin (ZOCOR) 20 MG tablet Take 1 tablet (20 mg total) by mouth at bedtime. 01/13/16  Yes McGowan, Larene [place] A, PA-C  ?naproxen  (NAPROSYN) 500 MG tablet Take 1 tablet (500 mg total) by mouth as needed. 03/29/16   Tawni Millers, MD  ? ? ?Allergies as of 09/05/2021 - Review Complete 05/10/2021  ?Allergen Reaction Noted  ? Pollen extract Cough 05/24/2016  ? ? ?Family History  ?Problem Relation Age of Onset  ? Diabetes type II Mother   ? Diabetes type II Father   ? Gout Father   ? Breast cancer Neg Hx   ? ? ?Social History  ? ?Socioeconomic History  ? Marital status: Single  ?  Spouse name: Not on file  ? Number of children: Not on file  ? Years of education: Not on file  ? Highest education level: Not on file  ?Occupational History  ? Not on file  ?Tobacco Use  ? Smoking status: Every Day  ?  Packs/day: 0.05  ?  Years: 39.00  ?  Pack years: 1.95  ?  Types: E-cigarettes, Cigarettes  ? Smokeless tobacco: Never  ? Tobacco comments:  ?  started age 46  ?Vaping Use  ? Vaping Use: Some days  ?Substance and Sexual Activity  ? Alcohol use: Yes  ?  Alcohol/week: 0.0 standard drinks  ?  Comment: wine on occasion  ? Drug use: Not Currently  ? Sexual activity: Not on file  ?Other Topics Concern  ? Not on file  ?Social History Narrative  ? Not on file  ? ?Social Determinants of  Health  ? ?Financial Resource Strain: Not on file  ?Food Insecurity: Not on file  ?Transportation Needs: Not on file  ?Physical Activity: Not on file  ?Stress: Not on file  ?Social Connections: Not on file  ?Intimate Partner Violence: Not on file  ? ? ?Review of Systems: ?See HPI, otherwise negative ROS ? ?Physical Exam: ?BP (!) 148/83   Pulse 85   Temp (!) 96 ?F (35.6 ?C) (Temporal)   Resp 16   Ht '5\' 6"'$  (1.676 m)   Wt 71.7 kg   BMI 25.50 kg/m?  ?General:   Alert,  pleasant and cooperative in NAD ?Head:  Normocephalic and atraumatic. ?Neck:  Supple; no masses or thyromegaly. ?Lungs:  Clear throughout to auscultation.    ?Heart:  Regular rate and rhythm. ?Abdomen:  Soft, nontender and nondistended. Normal bowel sounds, without guarding, and without rebound.   ?Neurologic:  Alert  and  oriented x4;  grossly normal neurologically. ? ?Impression/Plan: ?Alyssa Black is here for an colonoscopy to be performed for a history of adenomatous polyps on 2018 ? ? ?Risks, benefits, limitations, and alternatives regarding  colonoscopy have been reviewed with the patient.  Questions have been answered.  All parties agreeable. ? ? ?Alyssa Lame, MD  12/20/2021, 8:33 AM ?

## 2021-12-20 NOTE — Anesthesia Postprocedure Evaluation (Signed)
Anesthesia Post Note ? ?Patient: Alyssa Black ? ?Procedure(s) Performed: COLONOSCOPY WITH PROPOFOL ? ?Patient location during evaluation: PACU ?Anesthesia Type: General ?Level of consciousness: awake and alert ?Pain management: pain level controlled ?Vital Signs Assessment: post-procedure vital signs reviewed and stable ?Respiratory status: spontaneous breathing, nonlabored ventilation, respiratory function stable and patient connected to nasal cannula oxygen ?Cardiovascular status: blood pressure returned to baseline and stable ?Postop Assessment: no apparent nausea or vomiting ?Anesthetic complications: no ? ? ?No notable events documented. ? ? ?Last Vitals:  ?Vitals:  ? 12/20/21 0945 12/20/21 1005  ?BP: (!) 97/46 134/81  ?Pulse:    ?Resp:    ?Temp: (!) 35.8 ?C   ?  ?Last Pain:  ?Vitals:  ? 12/20/21 1005  ?TempSrc:   ?PainSc: 0-No pain  ? ? ?  ?  ?  ?  ?  ?  ? ?Molli Barrows ? ? ? ? ?

## 2021-12-20 NOTE — Op Note (Signed)
[facility] ?Gastroenterology ?Patient Name: Meshelle Holness ?Procedure Date: 12/20/2021 9:16 AM ?MRN: [id number] ?Account #: [id number] ?Date of Birth: 02-09-1956 ?Admit Type: Outpatient ?Age: 66 ?Room: [facility] ENDO ROOM 4 ?Gender: Female ?Note Status: Finalized ?Instrument Name: Colonoscope [id number] ?Procedure:             Colonoscopy ?Indications:           High risk colon cancer surveillance: Personal history  ?                       of colonic polyps ?Providers:             Lucilla Lame MD, MD ?Medicines:             Propofol per Anesthesia ?Complications:         No immediate complications. ?Procedure:             Pre-Anesthesia Assessment: ?                       - Prior to the procedure, a History and Physical was  ?                       performed, and patient medications and allergies were  ?                       reviewed. The patient's tolerance of previous  ?                       anesthesia was also reviewed. The risks and benefits  ?                       of the procedure and the sedation options and risks  ?                       were discussed with the patient. All questions were  ?                       answered, and informed consent was obtained. Prior  ?                       Anticoagulants: The patient has taken no previous  ?                       anticoagulant or antiplatelet agents. ASA Grade  ?                       Assessment: II - A patient with mild systemic disease.  ?                       After reviewing the risks and benefits, the patient  ?                       was deemed in satisfactory condition to undergo the  ?                       procedure. ?                       After obtaining informed consent, the colonoscope was  ?  passed under direct vision. Throughout the procedure,  ?                       the patient's blood pressure, pulse, and oxygen  ?                       saturations were monitored continuously. The  ?                        Colonoscope was introduced through the anus and  ?                       advanced to the the cecum, identified by appendiceal  ?                       orifice and ileocecal valve. The colonoscopy was  ?                       performed without difficulty. The patient tolerated  ?                       the procedure well. The quality of the bowel  ?                       preparation was excellent. ?Findings: ?     The perianal and digital rectal examinations were normal. ?     A 5 mm polyp was found in the cecum. The polyp was sessile. The polyp  ?     was removed with a cold snare. Resection and retrieval were complete. ?     A 3 mm polyp was found in the ascending colon. The polyp was sessile.  ?     The polyp was removed with a cold snare. Resection and retrieval were  ?     complete. ?     Four sessile polyps were found in the sigmoid colon. The polyps were 3  ?     to 4 mm in size. These polyps were removed with a cold snare. Resection  ?     and retrieval were complete. ?     Multiple small-mouthed diverticula were found in the entire colon. ?     Non-bleeding internal hemorrhoids were found during retroflexion. The  ?     hemorrhoids were Grade II (internal hemorrhoids that prolapse but reduce  ?     spontaneously). ?Impression:            - One 5 mm polyp in the cecum, removed with a cold  ?                       snare. Resected and retrieved. ?                       - One 3 mm polyp in the ascending colon, removed with  ?                       a cold snare. Resected and retrieved. ?                       - Four 3 to 4 mm polyps in the sigmoid colon, removed  ?  with a cold snare. Resected and retrieved. ?                       - Diverticulosis in the entire examined colon. ?                       - Non-bleeding internal hemorrhoids. ?Recommendation:        - Discharge patient to home. ?                       - Resume previous diet. ?                       - Continue present medications. ?                        - Await pathology results. ?                       - Repeat colonoscopy in 5 years for surveillance. ?Procedure Code(s):     --- Professional --- ?                       [phone], Colonoscopy, flexible; with removal of  ?                       tumor(s), polyp(s), or other lesion(s) by snare  ?                       technique ?Diagnosis Code(s):     --- Professional --- ?                       Z86.010, Personal history of colonic polyps ?                       K63.5, Polyp of colon ?CPT copyright 2019 American Medical Association. All rights reserved. ?The codes documented in this report are preliminary and upon coder review may  ?be revised to meet current compliance requirements. ?Lucilla Lame MD, MD ?12/20/2021 9:42:49 AM ?This report has been signed electronically. ?Number of Addenda: 0 ?Note Initiated On: 12/20/2021 9:16 AM ?Scope Withdrawal Time: 0 hours 8 minutes 16 seconds  ?Total Procedure Duration: 0 hours 12 minutes 58 seconds  ?Estimated Blood Loss:  Estimated blood loss: none. ?     [facility] ?

## 2021-12-20 NOTE — Anesthesia Preprocedure Evaluation (Signed)
Anesthesia Evaluation  ?Patient identified by MRN, date of birth, ID band ?Patient awake ? ? ? ?Reviewed: ?Allergy & Precautions, H&P , NPO status , Patient's Chart, lab work & pertinent test results, reviewed documented beta blocker date and time  ? ?Airway ?Mallampati: II ? ?TM Distance: >3 FB ?Neck ROM: full ? ? ? Dental ?no notable dental hx. ?(+) Poor Dentition ?  ?Pulmonary ?neg pulmonary ROS, Current Smoker and Patient abstained from smoking.,  ?  ?Pulmonary exam normal ?breath sounds clear to auscultation ? ? ? ? ? ? Cardiovascular ?Exercise Tolerance: Good ?hypertension, On Medications ?negative cardio ROS ?Normal cardiovascular exam ?Rhythm:regular Rate:Normal ? ? ?  ?Neuro/Psych ?TIAnegative neurological ROS ? negative psych ROS  ? GI/Hepatic ?negative GI ROS, Neg liver ROS,   ?Endo/Other  ?negative endocrine ROSdiabetes, Well Controlled, Type 2, Oral Hypoglycemic Agents ? Renal/GU ?negative Renal ROS  ?negative genitourinary ?  ?Musculoskeletal ? ? Abdominal ?  ?Peds ? Hematology ?negative hematology ROS ?(+)   ?Anesthesia Other Findings ?Past Medical History: ?No date: Arthritis ?    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    Comment:  and lower back ?2000: TIA (transient ischemic attack) ?    Comment:  no deficits ?Past Surgical History: ?12/11/2016: COLONOSCOPY WITH PROPOFOL; N/A ?    Comment:  Procedure: COLONOSCOPY WITH PROPOFOL;  Surgeon: Evangeline Gula  ?             Allen Norris, MD;  Location: [place];  Service:  ?             Endoscopy;  Laterality: N/A; ?12/11/2016: POLYPECTOMY ?    Comment:  Procedure: POLYPECTOMY;  Surgeon: Lucilla Lame, MD;   ?             Location: [place];  Service: Endoscopy;; ?No date: TUBAL LIGATION ?BMI   ? Body Mass Index: 25.50 kg/m?  ?  ? Reproductive/Obstetrics ?negative OB ROS ? ?  ? ? ? ? ? ? ? ? ? ? ? ? ? ?   ?  ? ? ? ? ? ? ? ? ?Anesthesia Physical ?Anesthesia Plan ? ?ASA: 3 ? ?Anesthesia Plan: General  ? ?Post-op Pain Management:   ? ?Induction:  ? ?PONV Risk Score and Plan:  ? ?Airway Management Planned:  ? ?Additional Equipment:  ? ?Intra-op Plan:  ? ?Post-operative Plan:  ? ?Informed Consent: I have reviewed the patients History and Physical, chart, labs and discussed the procedure including the risks, benefits and alternatives for the proposed anesthesia with the patient or authorized representative who has indicated his/her understanding and acceptance.  ? ? ? ?Dental Advisory Given ? ?Plan Discussed with: CRNA ? ?Anesthesia Plan Comments:   ? ? ? ? ? ? ?Anesthesia Quick Evaluation ? ?

## 2021-12-21 ENCOUNTER — Encounter: Payer: Self-pay | Admitting: Gastroenterology

## 2021-12-21 LAB — SURGICAL PATHOLOGY

## 2022-01-03 ENCOUNTER — Other Ambulatory Visit: Payer: Self-pay | Admitting: *Deleted

## 2022-01-03 DIAGNOSIS — Z122 Encounter for screening for malignant neoplasm of respiratory organs: Secondary | ICD-10-CM

## 2022-01-03 DIAGNOSIS — Z87891 Personal history of nicotine dependence: Secondary | ICD-10-CM

## 2022-01-03 DIAGNOSIS — F17210 Nicotine dependence, cigarettes, uncomplicated: Secondary | ICD-10-CM

## 2022-01-30 ENCOUNTER — Ambulatory Visit (INDEPENDENT_AMBULATORY_CARE_PROVIDER_SITE_OTHER): Payer: Medicare Other | Admitting: Acute Care

## 2022-01-30 ENCOUNTER — Encounter: Payer: Self-pay | Admitting: Acute Care

## 2022-01-30 DIAGNOSIS — F17210 Nicotine dependence, cigarettes, uncomplicated: Secondary | ICD-10-CM

## 2022-01-30 NOTE — Patient Instructions (Signed)
Thank you for participating in the [place] [facility] Scre[facility]. It was our pleasure to meet you today. We will call you with the results of your scan within the next few days. Your scan will be assigned a Lung RADS category score by the physicians reading the scans.  This Lung RADS score determines follow up scanning.  See below for description of categories, and follow up screening recommendations. We will be in touch to schedule your follow up screening annually or based on recommendations of our providers. We will fax a copy of your scan results to your Primary Care Physician, or the physician who referred you to the program, to ensure they have the results. Please call the office if you have any questions or concerns regarding your scanning experience or results.  Our office number is [phone]. Please speak with Denise Phelps, RN. , or  Denise Buckner RN, They are  our Lung Cancer Screening RN.'s If They are unavailable when you call, Please leave a message on the voice mail. We will return your call at our earliest convenience.This voice mail is monitored several times a day.  Remember, if your scan is normal, we will scan you annually as long as you continue to meet the criteria for the program. (Age 55-77, Current smoker or smoker who has quit within the last 15 years). If you are a smoker, remember, quitting is the single most powerful action that you can take to decrease your risk of lung cancer and other pulmonary, breathing related problems. We know quitting is hard, and we are here to help.  Please let us know if there is anything we can do to help you meet your goal of quitting. If you are a former smoker, congratulations. We are proud of you! Remain smoke free! Remember you can refer friends or family members through the number above.  We will screen them to make sure they meet criteria for the program. Thank you for helping us take better care of you by  participating in Lung Screening.  You can receive free nicotine replacement therapy ( patches, gum or mints) by calling 1-800-QUIT NOW. Please call so we can get you on the path to becoming  a non-smoker. I know it is hard, but you can do this!  Lung RADS Categories:  Lung RADS 1: no nodules or definitely non-concerning nodules.  Recommendation is for a repeat annual scan in 12 months.  Lung RADS 2:  nodules that are non-concerning in appearance and behavior with a very low likelihood of becoming an active cancer. Recommendation is for a repeat annual scan in 12 months.  Lung RADS 3: nodules that are probably non-concerning , includes nodules with a low likelihood of becoming an active cancer.  Recommendation is for a 6-month repeat screening scan. Often noted after an upper respiratory illness. We will be in touch to make sure you have no questions, and to schedule your 6-month scan.  Lung RADS 4 A: nodules with concerning findings, recommendation is most often for a follow up scan in 3 months or additional testing based on our provider's assessment of the scan. We will be in touch to make sure you have no questions and to schedule the recommended 3 month follow up scan.  Lung RADS 4 B:  indicates findings that are concerning. We will be in touch with you to schedule additional diagnostic testing based on our provider's  assessment of the scan.  Other options for assistance in smoking cessation (   As covered by your insurance benefits)  Hypnosis for smoking cessation  [employer] [phone]  Acupuncture for smoking cessation  [facility] [phone]   

## 2022-01-30 NOTE — Progress Notes (Signed)
Virtual Visit via Telephone Note  I connected with Alyssa Black on 06/28/21 at  2:00 PM EST by telephone and verified that I am speaking with the correct person using two identifiers.  Location: Patient: Home Provider: Working from home   I discussed the limitations, risks, security and privacy concerns of performing an evaluation and management service by telephone and the availability of in person appointments. I also discussed with the patient that there may be a patient responsible charge related to this service. The patient expressed understanding and agreed to proceed.  Shared Decision Making Visit Lung Cancer Screening Program [id number])   Eligibility: Age 66 y.o. Pack Years Smoking History Calculation 24 (# packs/per year x # years smoked) Recent History of coughing up blood  no Unexplained weight loss? no ( >Than 15 pounds within the last 6 months ) Prior History Lung / other cancer no (Diagnosis within the last 5 years already requiring surveillance chest CT Scans). Smoking Status Current Smoker Former Smokers: Years since quit: NA  Quit Date: NA  Visit Components: Discussion included one or more decision making aids. yes Discussion included risk/benefits of screening. yes Discussion included potential follow up diagnostic testing for abnormal scans. yes Discussion included meaning and risk of over diagnosis. yes Discussion included meaning and risk of False Positives. yes Discussion included meaning of total radiation exposure. yes  Counseling Included: Importance of adherence to annual lung cancer LDCT screening. yes Impact of comorbidities on ability to participate in the program. yes Ability and willingness to under diagnostic treatment. yes  Smoking Cessation Counseling: Current Smokers:  Discussed importance of smoking cessation. yes Information about tobacco cessation classes and interventions provided to patient. yes Patient provided with "ticket" for  LDCT Scan. yes Symptomatic Patient. yes  Counseling(Intermediate counseling: > three minutes) [id number] Diagnosis Code: Tobacco Use Z72.0 Asymptomatic Patient no  Counseling NA Former Smokers:  Discussed the importance of maintaining cigarette abstinence. yes Diagnosis Code: Personal History of Nicotine Dependence. X44.818 Information about tobacco cessation classes and interventions provided to patient. Yes Patient provided with "ticket" for LDCT Scan. yes Written Order for Lung Cancer Screening with LDCT placed in Epic. Yes (CT Chest Lung Cancer Screening Low Dose W/O CM) [id number] Z12.2-Screening of respiratory organs Z87.891-Personal history of nicotine dependence   I spent 25 minutes of face to face time with her discussing the risks and benefits of lung cancer screening. We viewed a power point together that explained in detail the above noted topics. We took the time to pause the power point at intervals to allow for questions to be asked and answered to ensure understanding. We discussed that she had taken the single most powerful action possible to decrease her risk of developing lung cancer when she quit smoking. I counseled her to remain smoke free, and to contact me if she ever had the desire to smoke again so that I can provide resources and tools to help support the effort to remain smoke free. We discussed the time and location of the scan, and that either  Doroteo Glassman RN or I will call with the results within  24-48 hours of receiving them. She has my card and contact information in the event he needs to speak with me, in addition to a copy of the power point we reviewed as a resource. She verbalized understanding of all of the above and had no further questions upon leaving the office.     I explained to the patient that there has been a  high incidence of coronary artery disease noted on these exams. I explained that this is a non-gated exam therefore degree or severity cannot be  determined. This patient is not on statin therapy. I have asked the patient to follow-up with their PCP regarding any incidental finding of coronary artery disease and management with diet or medication as they feel is clinically indicated. The patient verbalized understanding of the above and had no further questions.   I spent 3 minutes counseling on smoking cessation and the health risks of continued tobacco abuse    Jennefer Kopp D. Kenton Kingfisher, NP-C [facility] Personal contact information can be found on Amion  01/30/2022, 9:54 AM

## 2022-02-06 ENCOUNTER — Ambulatory Visit
Admission: RE | Admit: 2022-02-06 | Discharge: 2022-02-06 | Disposition: A | Discharge status: Home / Self Care | Payer: Medicare Other | Source: Ambulatory Visit | Attending: Acute Care | Admitting: Acute Care

## 2022-02-06 ENCOUNTER — Ambulatory Visit
Admission: RE | Admit: 2022-02-06 | Discharge: 2022-02-06 | Disposition: A | Discharge status: Home / Self Care | Payer: Medicare Other | Source: Ambulatory Visit | Attending: Family Medicine | Admitting: Family Medicine

## 2022-02-06 DIAGNOSIS — F17210 Nicotine dependence, cigarettes, uncomplicated: Secondary | ICD-10-CM

## 2022-02-06 DIAGNOSIS — Z122 Encounter for screening for malignant neoplasm of respiratory organs: Secondary | ICD-10-CM | POA: Insufficient documentation

## 2022-02-06 DIAGNOSIS — Z1231 Encounter for screening mammogram for malignant neoplasm of breast: Secondary | ICD-10-CM | POA: Insufficient documentation

## 2022-02-06 DIAGNOSIS — Z87891 Personal history of nicotine dependence: Secondary | ICD-10-CM | POA: Insufficient documentation

## 2022-02-07 ENCOUNTER — Other Ambulatory Visit: Payer: Self-pay | Admitting: Acute Care

## 2022-02-07 DIAGNOSIS — F17210 Nicotine dependence, cigarettes, uncomplicated: Secondary | ICD-10-CM

## 2022-02-07 DIAGNOSIS — Z87891 Personal history of nicotine dependence: Secondary | ICD-10-CM

## 2022-02-07 DIAGNOSIS — Z122 Encounter for screening for malignant neoplasm of respiratory organs: Secondary | ICD-10-CM

## 2022-12-19 ENCOUNTER — Other Ambulatory Visit: Payer: Self-pay | Admitting: Family Medicine

## 2022-12-19 DIAGNOSIS — Z1231 Encounter for screening mammogram for malignant neoplasm of breast: Secondary | ICD-10-CM

## 2023-01-07 ENCOUNTER — Other Ambulatory Visit: Payer: Self-pay | Admitting: Acute Care

## 2023-01-07 DIAGNOSIS — F17210 Nicotine dependence, cigarettes, uncomplicated: Secondary | ICD-10-CM

## 2023-01-07 DIAGNOSIS — Z87891 Personal history of nicotine dependence: Secondary | ICD-10-CM

## 2023-01-07 DIAGNOSIS — Z122 Encounter for screening for malignant neoplasm of respiratory organs: Secondary | ICD-10-CM

## 2023-02-05 ENCOUNTER — Other Ambulatory Visit: Payer: Self-pay | Admitting: Family Medicine

## 2023-02-05 DIAGNOSIS — Z780 Asymptomatic menopausal state: Secondary | ICD-10-CM

## 2023-02-08 ENCOUNTER — Ambulatory Visit: Payer: Medicaid Other

## 2023-02-19 ENCOUNTER — Ambulatory Visit
Admission: RE | Admit: 2023-02-19 | Discharge: 2023-02-19 | Disposition: A | Discharge status: Home / Self Care | Payer: 59 | Source: Ambulatory Visit | Attending: Acute Care | Admitting: Acute Care

## 2023-02-19 ENCOUNTER — Ambulatory Visit
Admission: RE | Admit: 2023-02-19 | Discharge: 2023-02-19 | Disposition: A | Discharge status: Home / Self Care | Payer: 59 | Source: Ambulatory Visit | Attending: Family Medicine | Admitting: Family Medicine

## 2023-02-19 DIAGNOSIS — Z1231 Encounter for screening mammogram for malignant neoplasm of breast: Secondary | ICD-10-CM | POA: Insufficient documentation

## 2023-02-19 DIAGNOSIS — Z122 Encounter for screening for malignant neoplasm of respiratory organs: Secondary | ICD-10-CM

## 2023-02-19 DIAGNOSIS — F17210 Nicotine dependence, cigarettes, uncomplicated: Secondary | ICD-10-CM | POA: Insufficient documentation

## 2023-02-19 DIAGNOSIS — Z87891 Personal history of nicotine dependence: Secondary | ICD-10-CM

## 2023-02-20 ENCOUNTER — Other Ambulatory Visit: Payer: Self-pay

## 2023-02-20 DIAGNOSIS — F17210 Nicotine dependence, cigarettes, uncomplicated: Secondary | ICD-10-CM

## 2023-02-20 DIAGNOSIS — Z122 Encounter for screening for malignant neoplasm of respiratory organs: Secondary | ICD-10-CM

## 2023-02-20 DIAGNOSIS — Z87891 Personal history of nicotine dependence: Secondary | ICD-10-CM

## 2023-04-08 IMAGING — MG MM DIGITAL SCREENING BILAT W/ TOMO AND CAD
8 series · 8 of 24 positions shown · non-contrast
Comparison: Previous exam(s).

CLINICAL DATA: Screening.

EXAM:
DIGITAL SCREENING BILATERAL MAMMOGRAM WITH TOMOSYNTHESIS AND CAD
TECHNIQUE: Bilateral screening digital craniocaudal and mediolateral oblique
mammograms were obtained. Bilateral screening digital breast
tomosynthesis was performed. The images were evaluated with
computer-aided detection.

[L CC synth-2D]
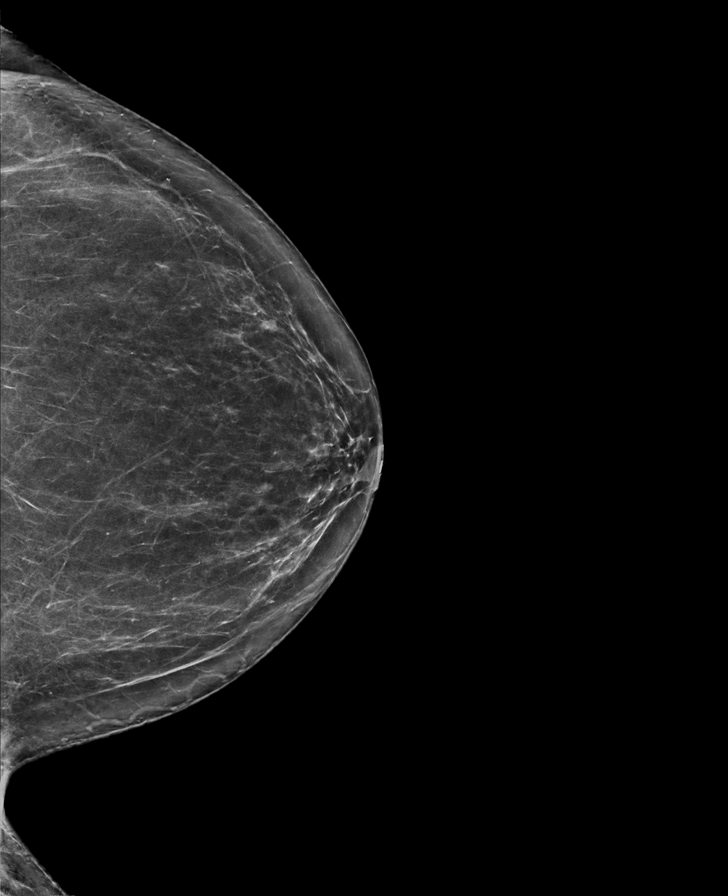

[R MLO synth-2D]
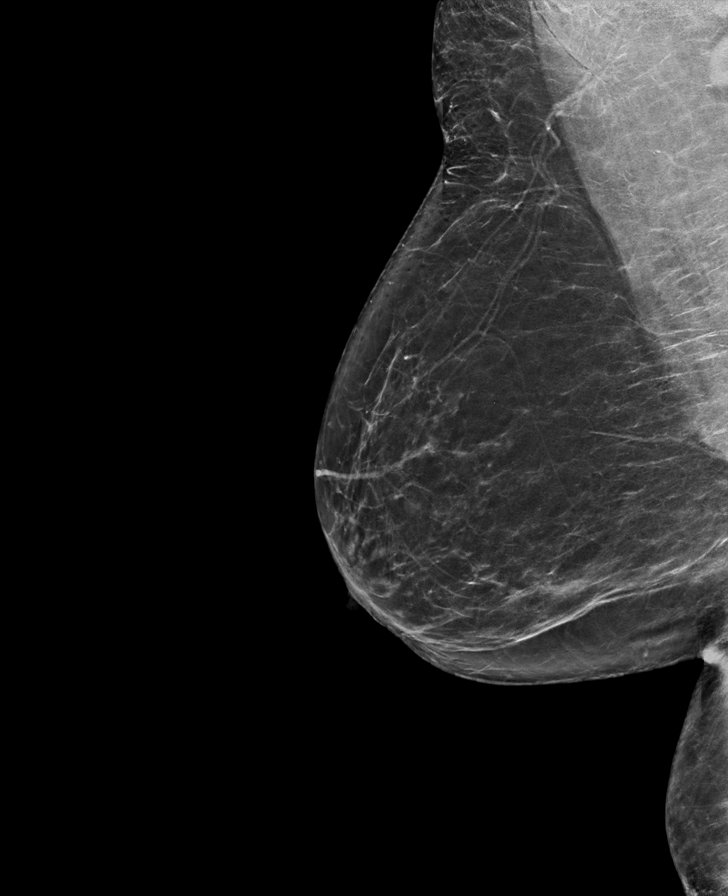

[L MLO synth-2D]
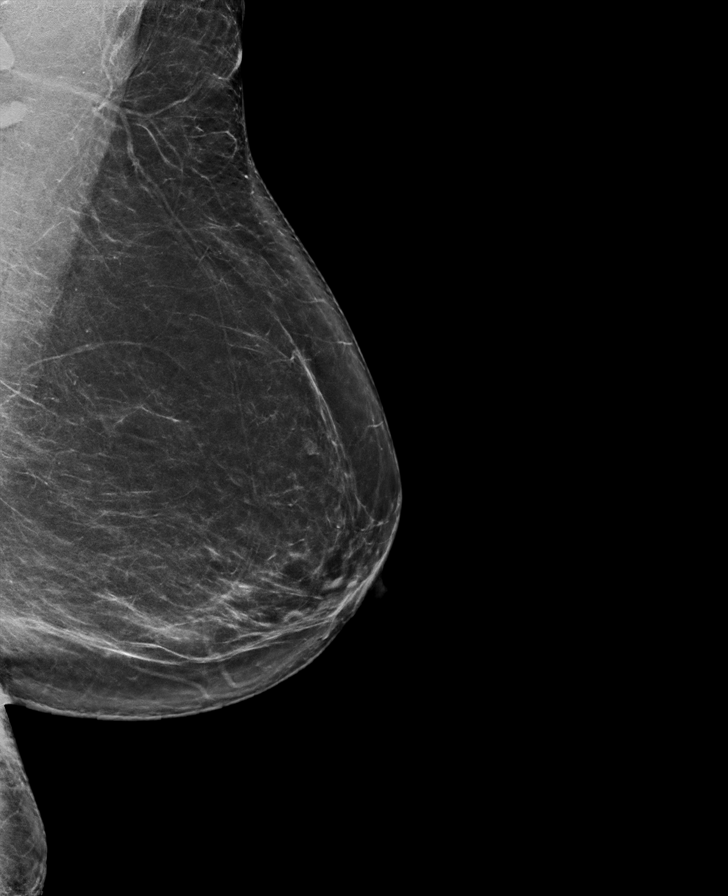

[R CC synth-2D]
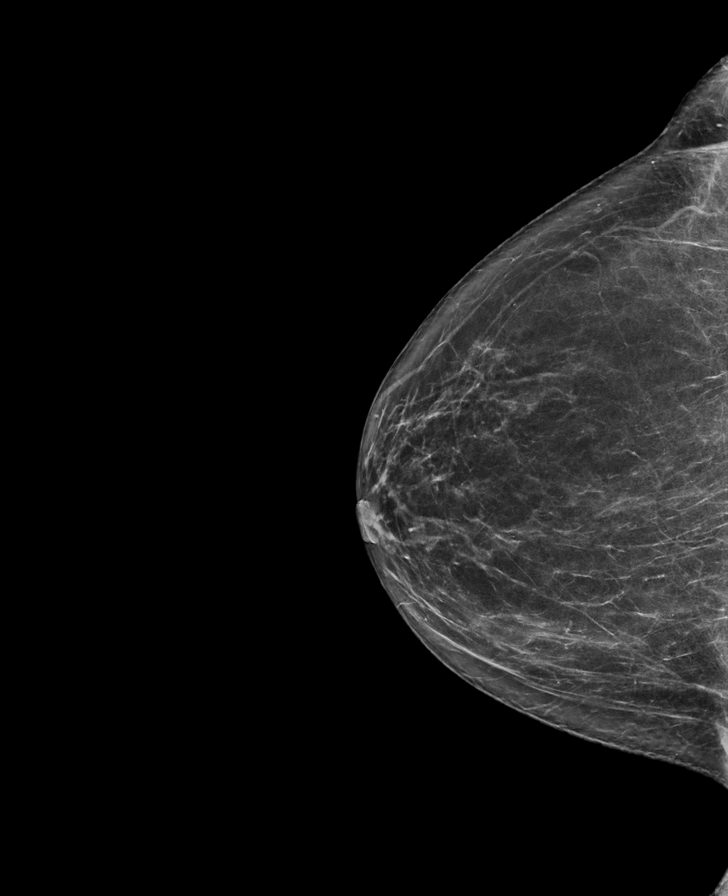

[L CC tomo · tomo slice 39/76.0]
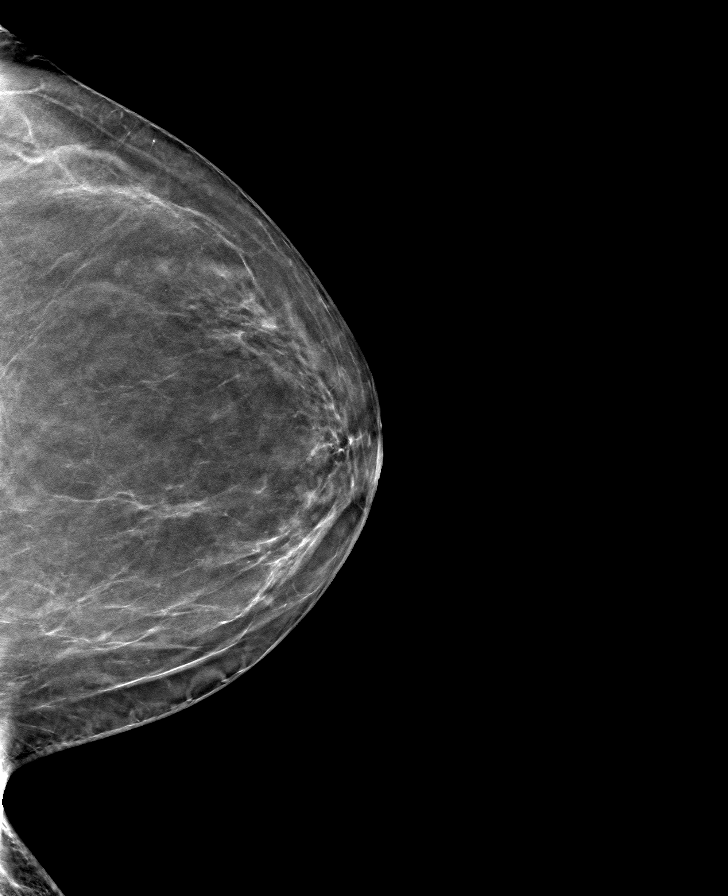

[R CC tomo · tomo slice 37/72.0]
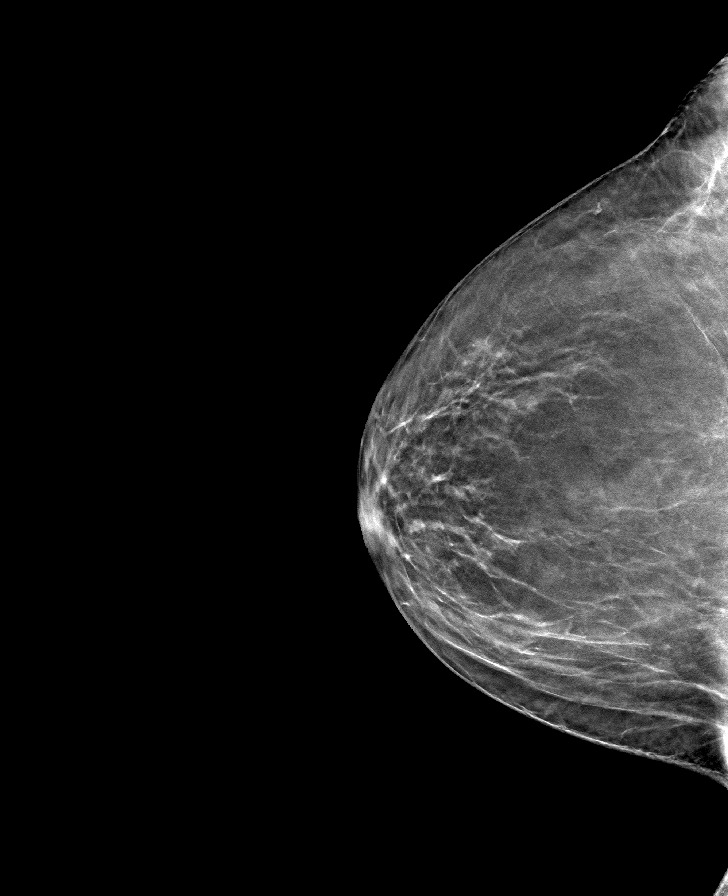

[R MLO tomo · tomo slice 39/78.0]
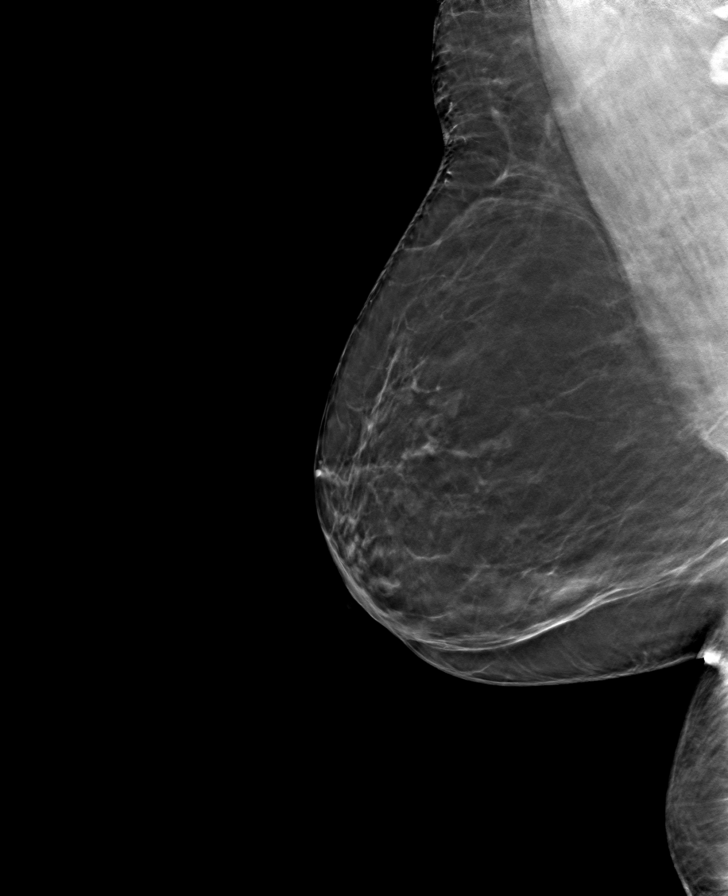

[L MLO tomo · tomo slice 42/83.0]
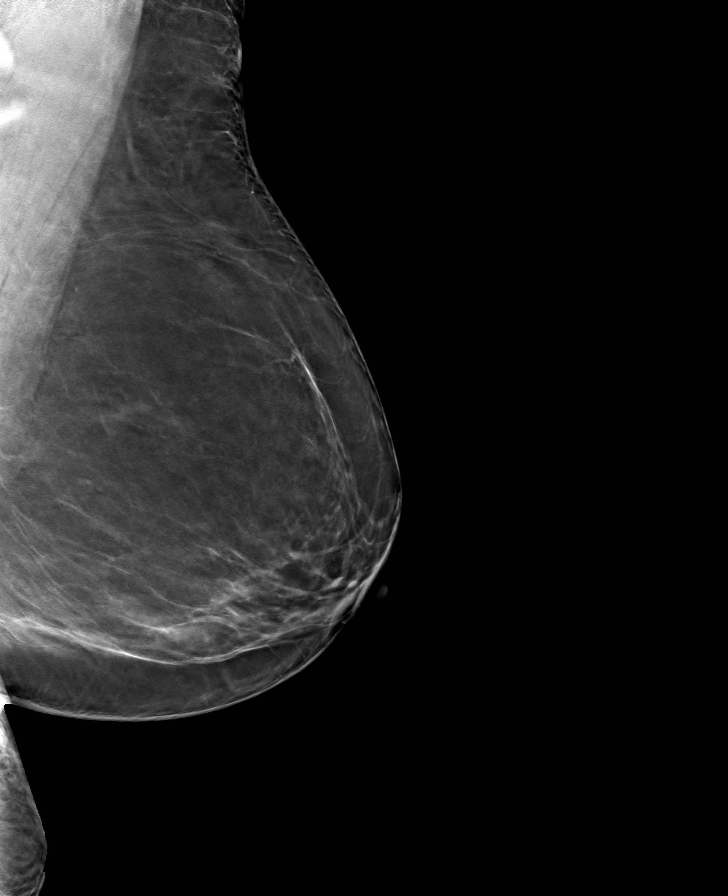

[8 of 24 positions shown; findings below may reference images not displayed]

ACR Breast Density Category b: There are scattered areas of
fibroglandular density.
FINDINGS: There are no findings suspicious for malignancy.
IMPRESSION: No mammographic evidence of malignancy. A result letter of this
screening mammogram will be mailed directly to the patient.

RECOMMENDATION:
Screening mammogram in one year. (Code:51-O-LD2)

BI-RADS CATEGORY  1: Negative.

## 2023-05-22 ENCOUNTER — Other Ambulatory Visit: Payer: Self-pay | Admitting: Gastroenterology

## 2024-01-11 ENCOUNTER — Other Ambulatory Visit: Payer: Self-pay | Admitting: Family Medicine

## 2024-01-11 DIAGNOSIS — Z1231 Encounter for screening mammogram for malignant neoplasm of breast: Secondary | ICD-10-CM

## 2024-02-20 ENCOUNTER — Ambulatory Visit
Admission: RE | Admit: 2024-02-20 | Discharge: 2024-02-20 | Disposition: A | Discharge status: Home / Self Care | Source: Ambulatory Visit | Attending: Family Medicine | Admitting: Family Medicine

## 2024-02-20 ENCOUNTER — Ambulatory Visit
Admission: RE | Admit: 2024-02-20 | Discharge: 2024-02-20 | Disposition: A | Discharge status: Home / Self Care | Source: Ambulatory Visit | Attending: Acute Care | Admitting: Acute Care

## 2024-02-20 DIAGNOSIS — Z1231 Encounter for screening mammogram for malignant neoplasm of breast: Secondary | ICD-10-CM | POA: Diagnosis present

## 2024-02-20 DIAGNOSIS — F17210 Nicotine dependence, cigarettes, uncomplicated: Secondary | ICD-10-CM | POA: Insufficient documentation

## 2024-02-20 DIAGNOSIS — Z122 Encounter for screening for malignant neoplasm of respiratory organs: Secondary | ICD-10-CM | POA: Diagnosis present

## 2024-02-20 DIAGNOSIS — Z87891 Personal history of nicotine dependence: Secondary | ICD-10-CM | POA: Diagnosis present

## 2024-03-03 ENCOUNTER — Telehealth: Payer: Self-pay | Admitting: *Deleted

## 2024-03-03 NOTE — Telephone Encounter (Signed)
 Attempted to contact patient to discuss lung screening CT results but unable to leave a message. Will try again later.

## 2024-03-04 NOTE — Telephone Encounter (Signed)
 Spoke with patient's son, he is calling patient to have her call the office and review recent CT results. Pt's phone rings and drops to a busy signal.

## 2024-03-05 ENCOUNTER — Telehealth: Payer: Self-pay | Admitting: Acute Care

## 2024-03-05 NOTE — Telephone Encounter (Signed)
 New 4.6 mm medial right lower lobe nodule. Lung-RADS 3, probably benign findings. Short-term follow-up in 6 months is recommended with repeat low-dose chest CT without contrast (please use the following order, CT CHEST LCS NODULE FOLLOW-UP W/O CM). 2.  Aortic atherosclerosis (ICD10-I70.0). 3.  Emphysema (ICD10-J43.9).  Agree with a 6 month follow up.  She is on statin therapy for CAD Please fax results to PCP. Thanks so much

## 2024-03-05 NOTE — Telephone Encounter (Signed)
 No response from outreach to son. Phone still rings to busy signal. Letter mailed to home.

## 2024-03-06 NOTE — Telephone Encounter (Signed)
 See additional encounter notes for f/u.

## 2024-03-06 NOTE — Telephone Encounter (Signed)
 Updated note from Groce, NP  New 4.6 mm medial right lower lobe nodule. Lung-RADS 3, probably benign findings. Short-term follow-up in 6 months is recommended with repeat low-dose chest CT without contrast (please use the following order, CT CHEST LCS NODULE FOLLOW-UP W/O CM). 2.  Aortic atherosclerosis (ICD10-I70.0). 3.  Emphysema (ICD10-J43.9).   Agree with a 6 month follow up.  She is on statin therapy for CAD Please fax results to PCP. Thanks so much

## 2024-03-11 ENCOUNTER — Other Ambulatory Visit: Payer: Self-pay

## 2024-03-11 DIAGNOSIS — F17210 Nicotine dependence, cigarettes, uncomplicated: Secondary | ICD-10-CM

## 2024-03-11 DIAGNOSIS — Z87891 Personal history of nicotine dependence: Secondary | ICD-10-CM

## 2024-03-11 DIAGNOSIS — Z122 Encounter for screening for malignant neoplasm of respiratory organs: Secondary | ICD-10-CM

## 2024-03-11 DIAGNOSIS — R911 Solitary pulmonary nodule: Secondary | ICD-10-CM

## 2024-03-11 NOTE — Telephone Encounter (Signed)
 Spoke with patient. Reviewed results below. She is in agreement to complete a 6 month f/u scan to evaluate a new 4.6 mm nodule. Order placed and scheduled for 08/27/2024 at [facility]. Results and plan to PCP.

## 2024-08-04 ENCOUNTER — Emergency Department
Admission: EM | Admit: 2024-08-04 | Discharge: 2024-08-04 | Disposition: A | Discharge status: Home / Self Care | Attending: Emergency Medicine | Admitting: Emergency Medicine

## 2024-08-04 ENCOUNTER — Encounter: Payer: Self-pay | Admitting: *Deleted

## 2024-08-04 ENCOUNTER — Other Ambulatory Visit: Payer: Self-pay

## 2024-08-04 ENCOUNTER — Emergency Department

## 2024-08-04 DIAGNOSIS — R059 Cough, unspecified: Secondary | ICD-10-CM | POA: Diagnosis present

## 2024-08-04 DIAGNOSIS — E871 Hypo-osmolality and hyponatremia: Secondary | ICD-10-CM | POA: Insufficient documentation

## 2024-08-04 DIAGNOSIS — J101 Influenza due to other identified influenza virus with other respiratory manifestations: Secondary | ICD-10-CM | POA: Diagnosis not present

## 2024-08-04 DIAGNOSIS — E878 Other disorders of electrolyte and fluid balance, not elsewhere classified: Secondary | ICD-10-CM | POA: Diagnosis not present

## 2024-08-04 DIAGNOSIS — E860 Dehydration: Secondary | ICD-10-CM | POA: Diagnosis not present

## 2024-08-04 DIAGNOSIS — J111 Influenza due to unidentified influenza virus with other respiratory manifestations: Secondary | ICD-10-CM

## 2024-08-04 LAB — BASIC METABOLIC PANEL WITH GFR
Anion gap: 11 (ref 5–15)
BUN: 8 mg/dL (ref 8–23)
CO2: 29 mmol/L (ref 22–32)
Calcium: 9.3 mg/dL (ref 8.9–10.3)
Chloride: 89 mmol/L — ABNORMAL LOW (ref 98–111)
Creatinine, Ser: 0.6 mg/dL (ref 0.44–1.00)
GFR, Estimated: >60 mL/min
Glucose, Bld: 120 mg/dL — ABNORMAL HIGH (ref 70–99)
Potassium: 3.5 mmol/L (ref 3.5–5.1)
Sodium: 128 mmol/L — ABNORMAL LOW (ref 135–145)

## 2024-08-04 LAB — CBC
HCT: 38.9 % (ref 36.0–46.0)
Hemoglobin: 13.3 g/dL (ref 12.0–15.0)
MCH: 30.4 pg (ref 26.0–34.0)
MCHC: 34.2 g/dL (ref 30.0–36.0)
MCV: 88.8 fL (ref 80.0–100.0)
Platelets: 306 K/uL (ref 150–400)
RBC: 4.38 MIL/uL (ref 3.87–5.11)
RDW: 11.8 % (ref 11.5–15.5)
WBC: 8.4 K/uL (ref 4.0–10.5)
nRBC: 0 % (ref 0.0–0.2)

## 2024-08-04 LAB — RESP PANEL BY RT-PCR (RSV, FLU A&B, COVID)  RVPGX2
Influenza A by PCR: POSITIVE — AB
Influenza B by PCR: NEGATIVE
Resp Syncytial Virus by PCR: NEGATIVE
SARS Coronavirus 2 by RT PCR: NEGATIVE

## 2024-08-04 LAB — TROPONIN T, HIGH SENSITIVITY: Troponin T High Sensitivity: <15 ng/L (ref 0–19)

## 2024-08-04 MED ORDER — ALBUTEROL SULFATE HFA 108 (90 BASE) MCG/ACT IN AERS: 2.0000 | INHALATION_SPRAY | Freq: Four times a day (QID) | RESPIRATORY_TRACT | 0 refills | Status: DC | PRN

## 2024-08-04 MED ORDER — SODIUM CHLORIDE 0.9 % IV BOLUS
1000.0000 mL | Freq: Once | INTRAVENOUS | Status: AC
  Administered 2024-08-04: 1000 mL via INTRAVENOUS

## 2024-08-04 MED ORDER — OXYMETAZOLINE HCL 0.05 % NA SOLN: 2.0000 | Freq: Two times a day (BID) | NASAL | 2 refills | Status: DC

## 2024-08-04 NOTE — ED Provider Notes (Signed)
 "  [facility] Provider Note    Event Date/Time   First MD Initiated Contact with Patient 08/04/24 1757     (approximate)   History   Shortness of Breath and Cough   HPI  Alyssa Black is a 68 y.o. female who presented to the emergency department today because of concerns for some shortness of breath, cough, congestion, fatigue.  Symptoms started yesterday got worse through the night.  She states that it started somewhat suddenly.  Did have a hard time sleeping secondary to the congestion.  She denies any known sick contacts.     Physical Exam   Triage Vital Signs: ED Triage Vitals  Encounter Vitals Group     BP 08/04/24 1651 (!) 163/80     Girls Systolic BP Percentile --      Girls Diastolic BP Percentile --      Boys Systolic BP Percentile --      Boys Diastolic BP Percentile --      Pulse Rate 08/04/24 1651 88     Resp 08/04/24 1651 20     Temp 08/04/24 1651 98 F (36.7 C)     Temp Source 08/04/24 1651 Oral     SpO2 08/04/24 1651 97 %     Weight 08/04/24 1648 152 lb (68.9 kg)     Height 08/04/24 1648 5' 5 (1.651 m)     Head Circumference --      Peak Flow --      Pain Score 08/04/24 1647 8     Pain Loc --      Pain Education --      Exclude from Growth Chart --     Most recent vital signs: Vitals:   08/04/24 1651  BP: (!) 163/80  Pulse: 88  Resp: 20  Temp: 98 F (36.7 C)  SpO2: 97%   General: Awake, alert, oriented. CV:  Good peripheral perfusion. Regular rate and rhythm. Resp:  Normal effort. Lungs clear. Abd:  No distention.    ED Results / Procedures / Treatments   Labs (all labs ordered are listed, but only abnormal results are displayed) Labs Reviewed  RESP PANEL BY RT-PCR (RSV, FLU A&B, COVID)  [id number] - Abnormal; Notable for the following components:      Result Value   Influenza A by PCR POSITIVE (*)    All other components within normal limits  BASIC METABOLIC PANEL WITH GFR  CBC  TROPONIN T, HIGH SENSITIVITY      EKG  I, Guadalupe Eagles, attending physician, personally viewed and interpreted this EKG  EKG Time: 1653 Rate: 80 Rhythm: normal sinus rhythm Axis: normal Intervals: qtc 424 QRS: narrow ST changes: no st elevation Impression: normal ekg   RADIOLOGY I independently interpreted and visualized the CXR. My interpretation: No pneumonia Radiology interpretation:  IMPRESSION:  1. Emphysema. No acute cardiopulmonary abnormality.     PROCEDURES:  Critical Care performed: No    MEDICATIONS ORDERED IN ED: Medications - No data to display   IMPRESSION / MDM / ASSESSMENT AND PLAN / ED COURSE  I reviewed the triage vital signs and the nursing notes.                              Differential diagnosis includes, but is not limited to, viral illness, anemia, dehydration  Patient's presentation is most consistent with acute presentation with potential threat to life or bodily function.   Patient  presented to the emergency department today with signs and symptoms concerning for viral URI.  Patient did test positive for influenza.  Blood work does show signs of dehydration with hyponatremia and hypochloremia.  Patient was given IV fluids here.  Discussed with patient Tamiflu however at this time patient felt comfortable deferring and I think that is reasonable.  Will discharge with prescriptions for medication to help with symptomatic treatment.  Encouraged hydration.      FINAL CLINICAL IMPRESSION(S) / ED DIAGNOSES   Final diagnoses:  Influenza     Note:  This document was prepared using Dragon voice recognition software and may include unintentional dictation errors.    Floy Roberts, MD 08/04/24 2119  "

## 2024-08-04 NOTE — ED Triage Notes (Signed)
 Pt ambulatory to triage.  Pt has a cough and later became sob.  Pt reports feeling weak.  Sx began yesterday   pt alert  speech clear.

## 2024-08-10 ENCOUNTER — Other Ambulatory Visit: Payer: Self-pay

## 2024-08-10 ENCOUNTER — Encounter: Payer: Self-pay | Admitting: Emergency Medicine

## 2024-08-10 ENCOUNTER — Emergency Department

## 2024-08-10 ENCOUNTER — Inpatient Hospital Stay
Admission: EM | Admit: 2024-08-10 | Discharge: 2024-08-14 | DRG: 190 | Disposition: A | Discharge status: Home Health | Attending: Internal Medicine | Admitting: Internal Medicine

## 2024-08-10 DIAGNOSIS — Z8673 Personal history of transient ischemic attack (TIA), and cerebral infarction without residual deficits: Secondary | ICD-10-CM | POA: Diagnosis not present

## 2024-08-10 DIAGNOSIS — J9601 Acute respiratory failure with hypoxia: Secondary | ICD-10-CM

## 2024-08-10 DIAGNOSIS — Z79899 Other long term (current) drug therapy: Secondary | ICD-10-CM

## 2024-08-10 DIAGNOSIS — R0602 Shortness of breath: Secondary | ICD-10-CM | POA: Diagnosis present

## 2024-08-10 DIAGNOSIS — F17210 Nicotine dependence, cigarettes, uncomplicated: Secondary | ICD-10-CM | POA: Diagnosis present

## 2024-08-10 DIAGNOSIS — I10 Essential (primary) hypertension: Secondary | ICD-10-CM | POA: Diagnosis present

## 2024-08-10 DIAGNOSIS — E119 Type 2 diabetes mellitus without complications: Secondary | ICD-10-CM | POA: Diagnosis present

## 2024-08-10 DIAGNOSIS — J441 Chronic obstructive pulmonary disease with (acute) exacerbation: Secondary | ICD-10-CM | POA: Diagnosis present

## 2024-08-10 DIAGNOSIS — J9621 Acute and chronic respiratory failure with hypoxia: Secondary | ICD-10-CM | POA: Diagnosis present

## 2024-08-10 DIAGNOSIS — E785 Hyperlipidemia, unspecified: Secondary | ICD-10-CM | POA: Diagnosis present

## 2024-08-10 DIAGNOSIS — J111 Influenza due to unidentified influenza virus with other respiratory manifestations: Secondary | ICD-10-CM

## 2024-08-10 DIAGNOSIS — E876 Hypokalemia: Secondary | ICD-10-CM | POA: Diagnosis present

## 2024-08-10 DIAGNOSIS — Z833 Family history of diabetes mellitus: Secondary | ICD-10-CM | POA: Diagnosis not present

## 2024-08-10 LAB — CBC
HCT: 39.2 % (ref 36.0–46.0)
Hemoglobin: 13.9 g/dL (ref 12.0–15.0)
MCH: 30.4 pg (ref 26.0–34.0)
MCHC: 35.5 g/dL (ref 30.0–36.0)
MCV: 85.8 fL (ref 80.0–100.0)
Platelets: 249 K/uL (ref 150–400)
RBC: 4.57 MIL/uL (ref 3.87–5.11)
RDW: 11.7 % (ref 11.5–15.5)
WBC: 11.3 K/uL — ABNORMAL HIGH (ref 4.0–10.5)
nRBC: 0 % (ref 0.0–0.2)

## 2024-08-10 LAB — BASIC METABOLIC PANEL WITH GFR
Anion gap: 13 (ref 5–15)
BUN: 5 mg/dL — ABNORMAL LOW (ref 8–23)
CO2: 28 mmol/L (ref 22–32)
Calcium: 9.1 mg/dL (ref 8.9–10.3)
Chloride: 90 mmol/L — ABNORMAL LOW (ref 98–111)
Creatinine, Ser: 0.51 mg/dL (ref 0.44–1.00)
GFR, Estimated: >60 mL/min
Glucose, Bld: 160 mg/dL — ABNORMAL HIGH (ref 70–99)
Potassium: 3.1 mmol/L — ABNORMAL LOW (ref 3.5–5.1)
Sodium: 131 mmol/L — ABNORMAL LOW (ref 135–145)

## 2024-08-10 LAB — TROPONIN T, HIGH SENSITIVITY
Troponin T High Sensitivity: 48 ng/L — ABNORMAL HIGH (ref 0–19)
Troponin T High Sensitivity: 53 ng/L — ABNORMAL HIGH (ref 0–19)

## 2024-08-10 LAB — MAGNESIUM: Magnesium: 2.1 mg/dL (ref 1.7–2.4)

## 2024-08-10 MED ORDER — GUAIFENESIN-DM 100-10 MG/5ML PO SYRP
5.0000 mL | ORAL_SOLUTION | ORAL | Status: DC | PRN
  Administered 2024-08-10: 5 mL via ORAL
  Filled 2024-08-10: qty 10

## 2024-08-10 MED ORDER — SODIUM CHLORIDE 0.9 % IV SOLN
1.0000 g | INTRAVENOUS | Status: DC
  Administered 2024-08-10 – 2024-08-13 (×4): 1 g via INTRAVENOUS
  Filled 2024-08-10: qty 10

## 2024-08-10 MED ORDER — ONDANSETRON HCL 4 MG/2ML IJ SOLN: 4.0000 mg | Freq: Four times a day (QID) | INTRAMUSCULAR | Status: DC | PRN

## 2024-08-10 MED ORDER — METHYLPREDNISOLONE SODIUM SUCC 125 MG IJ SOLR
125.0000 mg | Freq: Once | INTRAMUSCULAR | Status: AC
  Administered 2024-08-10: 125 mg via INTRAVENOUS
  Filled 2024-08-10: qty 2

## 2024-08-10 MED ORDER — LISINOPRIL 5 MG PO TABS
2.5000 mg | ORAL_TABLET | Freq: Every day | ORAL | Status: DC
  Administered 2024-08-10 – 2024-08-11 (×2): 2.5 mg via ORAL
  Filled 2024-08-10: qty 1

## 2024-08-10 MED ORDER — METHYLPREDNISOLONE SODIUM SUCC 40 MG IJ SOLR
40.0000 mg | Freq: Two times a day (BID) | INTRAMUSCULAR | Status: AC
  Administered 2024-08-10 – 2024-08-11 (×2): 40 mg via INTRAVENOUS
  Filled 2024-08-10: qty 1

## 2024-08-10 MED ORDER — ENOXAPARIN SODIUM 40 MG/0.4ML IJ SOSY
40.0000 mg | PREFILLED_SYRINGE | INTRAMUSCULAR | Status: DC
  Administered 2024-08-10 – 2024-08-13 (×4): 40 mg via SUBCUTANEOUS
  Filled 2024-08-10: qty 0.4

## 2024-08-10 MED ORDER — SIMVASTATIN 20 MG PO TABS
20.0000 mg | ORAL_TABLET | Freq: Every day | ORAL | Status: DC
  Administered 2024-08-10 – 2024-08-13 (×4): 20 mg via ORAL
  Filled 2024-08-10: qty 2

## 2024-08-10 MED ORDER — HYDROCHLOROTHIAZIDE 25 MG PO TABS
25.0000 mg | ORAL_TABLET | Freq: Every day | ORAL | Status: DC
  Administered 2024-08-10 – 2024-08-14 (×5): 25 mg via ORAL
  Filled 2024-08-10: qty 1

## 2024-08-10 MED ORDER — TRAZODONE HCL 50 MG PO TABS: 25.0000 mg | ORAL_TABLET | Freq: Every evening | ORAL | Status: DC | PRN

## 2024-08-10 MED ORDER — ACETAMINOPHEN 325 MG PO TABS: 650.0000 mg | ORAL_TABLET | Freq: Four times a day (QID) | ORAL | Status: DC | PRN

## 2024-08-10 MED ORDER — MAGNESIUM HYDROXIDE 400 MG/5ML PO SUSP: 30.0000 mL | Freq: Every day | ORAL | Status: DC | PRN

## 2024-08-10 MED ORDER — SODIUM CHLORIDE 0.9 % IV SOLN: INTRAVENOUS | Status: AC

## 2024-08-10 MED ORDER — ONDANSETRON HCL 4 MG PO TABS: 4.0000 mg | ORAL_TABLET | Freq: Four times a day (QID) | ORAL | Status: DC | PRN

## 2024-08-10 MED ORDER — IPRATROPIUM-ALBUTEROL 0.5-2.5 (3) MG/3ML IN SOLN
6.0000 mL | Freq: Once | RESPIRATORY_TRACT | Status: AC
  Administered 2024-08-10: 6 mL via RESPIRATORY_TRACT
  Filled 2024-08-10: qty 6

## 2024-08-10 MED ORDER — ACETAMINOPHEN 650 MG RE SUPP: 650.0000 mg | Freq: Four times a day (QID) | RECTAL | Status: DC | PRN

## 2024-08-10 MED ORDER — PREDNISONE 20 MG PO TABS
40.0000 mg | ORAL_TABLET | Freq: Every day | ORAL | Status: AC
  Administered 2024-08-11 – 2024-08-14 (×4): 40 mg via ORAL

## 2024-08-10 MED ORDER — POTASSIUM CHLORIDE CRYS ER 20 MEQ PO TBCR
40.0000 meq | EXTENDED_RELEASE_TABLET | Freq: Once | ORAL | Status: AC
  Administered 2024-08-10: 40 meq via ORAL
  Filled 2024-08-10: qty 2

## 2024-08-10 NOTE — ED Notes (Signed)
 Called ccmd for pt monitoring

## 2024-08-10 NOTE — Assessment & Plan Note (Signed)
 -  The patient will be admitted to a medically monitored bed. - We will place the patient IV steroid therapy with IV Solu-Medrol as well as nebulized bronchodilator therapy with duonebs q.i.d. and q.4 hours p.r.n.Marland Kitchen - Mucolytic therapy will be provided with Mucinex and antibiotic therapy with IV Rocephin. - O2 protocol will be followed.

## 2024-08-10 NOTE — ED Notes (Signed)
 Notified CCMD of pt placement on cardiac monitor.

## 2024-08-10 NOTE — Assessment & Plan Note (Signed)
 Likely secondary to COPD exacerbation, currently on 2 L of oxygen with no baseline oxygen use. -Continue with supplemental oxygen-wean as tolerated

## 2024-08-10 NOTE — ED Provider Notes (Signed)
 "  [facility] Provider Note    Event Date/Time   First MD Initiated Contact with Patient 08/10/24 1420     (approximate)   History   Shortness of Breath   HPI  Alyssa Black is a 68 y.o. female who presents to the ED for evaluation of Shortness of Breath   I reviewed ED visit from 6 days ago, diagnosed with influenza A.   Patient presents to the ED due to continued cough, congestion and difficulty breathing   Physical Exam   Triage Vital Signs: ED Triage Vitals  Encounter Vitals Group     BP 08/10/24 1342 (!) 142/127     Girls Systolic BP Percentile --      Girls Diastolic BP Percentile --      Boys Systolic BP Percentile --      Boys Diastolic BP Percentile --      Pulse Rate 08/10/24 1342 98     Resp 08/10/24 1342 20     Temp 08/10/24 1342 98 F (36.7 C)     Temp Source 08/10/24 1342 Oral     SpO2 08/10/24 1342 92 %     Weight 08/10/24 1341 151 lb 14.4 oz (68.9 kg)     Height 08/10/24 1341 5' 5 (1.651 m)     Head Circumference --      Peak Flow --      Pain Score 08/10/24 1341 0     Pain Loc --      Pain Education --      Exclude from Growth Chart --     Most recent vital signs: Vitals:   08/10/24 1342 08/10/24 1345  BP: (!) 142/127 (!) 165/83  Pulse: 98   Resp: 20   Temp: 98 F (36.7 C)   SpO2: 92%     General: Awake, no distress.  CV:  Good peripheral perfusion.  Resp:  Mild tachypnea without distress.  Wheezing throughout and decreased airflow Abd:  No distention.  Soft and nontender MSK:  No deformity noted.  No peripheral edema Neuro:  No focal deficits appreciated. Other:     ED Results / Procedures / Treatments   Labs (all labs ordered are listed, but only abnormal results are displayed) Labs Reviewed  BASIC METABOLIC PANEL WITH GFR - Abnormal; Notable for the following components:      Result Value   Sodium 131 (*)    Potassium 3.1 (*)    Chloride 90 (*)    Glucose, Bld 160 (*)    BUN 5 (*)    All  other components within normal limits  CBC - Abnormal; Notable for the following components:   WBC 11.3 (*)    All other components within normal limits  MAGNESIUM   TROPONIN T, HIGH SENSITIVITY    EKG Sinus rhythm with a rate of 97 bpm.  Tremulous baseline, normal axis and intervals without clear signs of acute ischemia  RADIOLOGY CXR interpreted by me without evidence of acute cardiopulmonary pathology.  Official radiology report(s): No results found.  PROCEDURES and INTERVENTIONS:  .1-3 Lead EKG Interpretation  Performed by: Claudene Rover, MD Authorized by: Claudene Rover, MD     Interpretation: normal     ECG rate:  94   ECG rate assessment: normal     Rhythm: sinus rhythm     Ectopy: none     Conduction: normal     Medications  methylPREDNISolone  sodium succinate (SOLU-MEDROL ) 125 mg/2 mL injection 125 mg (has no  administration in time range)  ipratropium-albuterol  (DUONEB) 0.5-2.5 (3) MG/3ML nebulizer solution 6 mL (has no administration in time range)  potassium chloride  SA (KLOR-CON  M) CR tablet 40 mEq (has no administration in time range)     IMPRESSION / MDM / ASSESSMENT AND PLAN / ED COURSE  I reviewed the triage vital signs and the nursing notes.  Differential diagnosis includes, but is not limited to, ACS, PTX, PNA, muscle strain/spasm, PE, dissection, anxiety, pleural effusion  {Patient presents with symptoms of an acute illness or injury that is potentially life-threatening.  Patient presents with evidence of a COPD exacerbation precipitated by acute influenza.  No pneumothorax or infiltrates, will start on steroids and provide DuoNebs.  Mild hypokalemia, will replace orally.  Add on magnesium  level, marginal leukocytosis but no clear infectious etiology of her symptoms.  Signed out to oncoming physician to follow-up on clinical course      FINAL CLINICAL IMPRESSION(S) / ED DIAGNOSES   Final diagnoses:  COPD exacerbation (HCC)     Rx / DC Orders    ED Discharge Orders     None        Note:  This document was prepared using Dragon voice recognition software and may include unintentional dictation errors.   Claudene Rover, MD 08/10/24 1440  "

## 2024-08-10 NOTE — ED Provider Notes (Signed)
 Pt desatting into upper 80s, given Flu+ COPD exacerbation now with hypoxia we will admit for further treatment.  Pt agreeable to plan. Has rec'd solumedrol, duonebs.  Still feels poorly.   Dorothyann Drivers, MD 08/10/24 1600

## 2024-08-10 NOTE — Assessment & Plan Note (Signed)
 Will continue statin therapy

## 2024-08-10 NOTE — H&P (Signed)
 "     Alyssa Black   PATIENT NAME: Alyssa Black    MR#:  [id number]  DATE OF BIRTH:  Oct 07, 1955  DATE OF ADMISSION:  08/10/2024  PRIMARY CARE PHYSICIAN: [facility]   Patient is coming from: Home  REQUESTING/REFERRING PHYSICIAN: Dorothyann Drivers, MD  CHIEF COMPLAINT:   Chief Complaint  Patient presents with   Shortness of Breath    HISTORY OF PRESENT ILLNESS:  Alyssa Black is a 68 y.o. African-American female with medical history significant for osteoarthritis, type 2 diabetes mellitus, pretension, dyslipidemia, ongoing tobacco abuse and TIA, presented to the emergency room with acute onset worsening dyspnea with associated cough productive of clear sputum as well as wheezing over the last week.  She admitted to chills without measured fever.  She has been having nausea without vomiting or diarrhea.  She has been having chest pain with cough.  She continues to smoke few cigarettes per day.  She denied any palpitations.  No dysuria, oliguria or hematuria or flank pain.  No bleeding diathesis.  The patient had influenza A on 08/05/2027.  ED Course: When the patient came to the ER, BP was 165/83 with otherwise normal vital signs.  Pulse extremity was 87% on room air and later 98% on 2 L of O2 by nasal cannula.  Labs revealed hyponatremia 131 compared to 128 on 08/04/2024 and hypokalemia 3.1 and hypochloremia of 90 compared to 89 then, blood glucose of 168 and BMP was otherwise normal.  High-sensitivity troponin T was 48 and later 53 and CBC showed WBCs of 11.3. EKG as reviewed by me :EKG showed sinus rhythm with a rate of 97 with right atrial lodgment right axis deviation. Imaging: 2 view chest x-ray showed no acute cardiopulmonary disease.  The patient was given DuoNeb, 125 mg of IV Solu-Medrol  and 40 mcg potassium chloride .  She will be admitted to a telemetry bed for further evaluation and management. 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    and lower back   TIA (transient ischemic attack) 2000   no deficits    PAST SURGICAL HISTORY:   Past Surgical History:  Procedure Laterality Date   COLONOSCOPY WITH PROPOFOL  N/A 12/11/2016   Procedure: COLONOSCOPY WITH PROPOFOL ;  Surgeon: Rogelia Copping, MD;  Location: [facility];  Service: Endoscopy;  Laterality: N/A;   COLONOSCOPY WITH PROPOFOL  N/A 12/20/2021   Procedure: COLONOSCOPY WITH PROPOFOL ;  Surgeon: Copping Rogelia, MD;  Location: [facility];  Service: Endoscopy;  Laterality: N/A;   POLYPECTOMY  12/11/2016   Procedure: POLYPECTOMY;  Surgeon: Rogelia Copping, MD;  Location: [facility];  Service: Endoscopy;;   TUBAL LIGATION      SOCIAL HISTORY:   Social History   Tobacco Use   Smoking status: Every Day    Current packs/day: 0.50    Average packs/day: 0.5 packs/day for 48.0 years (24.0 ttl pk-yrs)    Types: E-cigarettes, Cigarettes   Smokeless tobacco: Never   Tobacco comments:    States that she has cut back to 8 cigarettes per day wit desire to quit completely   Substance Use Topics   Alcohol use: Yes    Alcohol/week: 0.0 standard drinks of alcohol    Comment: wine on occasion    FAMILY HISTORY:   Family History  Problem Relation Age of Onset   Diabetes type II Mother    Diabetes type II Father    Gout Father    Breast cancer Neg Hx     DRUG ALLERGIES:  Allergies[1]  REVIEW OF SYSTEMS:   ROS As per history of present illness. All pertinent systems were reviewed above. Constitutional, HEENT, cardiovascular, respiratory, GI, GU, musculoskeletal, neuro, psychiatric, endocrine, integumentary and hematologic systems were reviewed and are otherwise negative/unremarkable except for positive findings mentioned above in the HPI.   MEDICATIONS AT HOME:   Prior to Admission medications  Medication  Sig Start Date End Date Taking? Authorizing Provider  albuterol  (VENTOLIN  HFA) 108 (90 Base) MCG/ACT inhaler Inhale 2 puffs into the lungs every 6 (six) hours as needed for wheezing or shortness of breath. 08/04/24   Goodman, Graydon, MD  cetirizine (ZYRTEC) 10 MG tablet Take 10 mg by mouth 2 (two) times daily.    [provider]  cyclobenzaprine  (FLEXERIL ) 10 MG tablet Take 1 tablet (10 mg total) by mouth 3 (three) times daily as needed. 03/29/16   Chaplin, Don C, MD  diphenhydrAMINE (BENADRYL) 25 MG tablet Take 25 mg by mouth every 6 (six) hours as needed.    [provider]  hydrochlorothiazide  (HYDRODIURIL ) 25 MG tablet Take 1 tablet (25 mg total) by mouth daily. 01/13/16   Helon Kirsch A, PA-C  ibuprofen (ADVIL) 800 MG tablet Take 800 mg by mouth every 8 (eight) hours as needed. 04/25/21   [provider]  lisinopril  (ZESTRIL ) 2.5 MG tablet Take 2.5 mg by mouth daily. 04/09/21   [provider]  naproxen  (NAPROSYN ) 500 MG tablet Take 1 tablet (500 mg total) by mouth as needed. 03/29/16   Chaplin, Don C, MD  oxymetazoline  (AFRIN) 0.05 % nasal spray Place 2 sprays into both nostrils 2 (two) times daily. 08/04/24 08/04/25  Floy Roberts, MD  simvastatin  (ZOCOR ) 20 MG tablet Take 1 tablet (20 mg total) by mouth at bedtime. 01/13/16   McGowan, Kirsch A, PA-C      VITAL SIGNS:  Blood pressure (!) 140/84, pulse 98, temperature 98 F (36.7 C), temperature source Oral, resp. rate 20, height 5' 5 (1.651 m), weight 68.9 kg, SpO2 98%.  PHYSICAL EXAMINATION:  Physical Exam  GENERAL:  68 y.o.-year-old patient lying in the bed with no respiratory distress but with mild conversational dyspnea. EYES: Pupils equal, round, reactive to light and accommodation. No scleral icterus. Extraocular muscles intact.  HEENT: Head atraumatic, normocephalic. Oropharynx and nasopharynx clear.  NECK:  Supple, no jugular venous distention. No thyroid enlargement, no tenderness.   LUNGS: Diffuse expiratory wheezes with tight expiratory airflow and a harsh vesicular breathing.. No use of accessory muscles of respiration.  CARDIOVASCULAR: Regular rate and rhythm, S1, S2 normal. No murmurs, rubs, or gallops.  ABDOMEN: Soft, nondistended, nontender. Bowel sounds present. No organomegaly or mass.  EXTREMITIES: No pedal edema, cyanosis, or clubbing.  NEUROLOGIC: Cranial nerves II through XII are intact. Muscle strength 5/5 in all extremities. Sensation intact. Gait not checked.  PSYCHIATRIC: The patient is alert and oriented x 3.  Normal affect and good eye contact. SKIN: No obvious rash, lesion, or ulcer.   LABORATORY PANEL:   CBC Recent Labs  Lab 08/10/24 1345  WBC 11.3*  HGB 13.9  HCT 39.2  PLT 249   ------------------------------------------------------------------------------------------------------------------  Chemistries  Recent Labs  Lab 08/10/24 1345  NA 131*  K 3.1*  CL 90*  CO2 28  GLUCOSE 160*  BUN 5*  CREATININE 0.51  CALCIUM 9.1  MG 2.1   ------------------------------------------------------------------------------------------------------------------  Cardiac Enzymes No results for input(s): TROPONINI in the last 168 hours. ------------------------------------------------------------------------------------------------------------------  RADIOLOGY:  DG Chest 2 View Result Date: 08/10/2024 CLINICAL DATA:  Recently diagnosed with the flu presenting with shortness of breath. EXAM: CHEST - 2 VIEW COMPARISON:  August 04, 2024 FINDINGS: The heart size and mediastinal contours are within normal limits. Both lungs are clear. The visualized skeletal structures are unremarkable. IMPRESSION: No active cardiopulmonary disease. Electronically Signed   By: Suzen Dials M.D.   On: 08/10/2024 15:12      IMPRESSION AND PLAN:  Assessment and Plan: * COPD exacerbation (HCC) - The patient will be admitted to a medically monitored bed. - We  will place the patient IV steroid therapy with IV Solu-Medrol  as well as nebulized bronchodilator therapy with duonebs q.i.d. and q.4 hours p.r.n.SABRA - Mucolytic therapy will be provided with Mucinex  and antibiotic therapy with IV Rocephin . - O2 protocol will be followed.   Acute respiratory failure with hypoxia (HCC) - This is clearly secondary to #1. - O2 protocol be followed. - Management otherwise as above.  Dyslipidemia - Will continue statin therapy.  Essential hypertension - Continue antihypertensive therapy.   DVT prophylaxis: Lovenox . Advanced Care Planning:  Code Status: full code. Family Communication:  The plan of care was discussed in details with the patient (and family). I answered all questions. The patient agreed to proceed with the above mentioned plan. Further management will depend upon hospital course. Disposition Plan: Back to previous home environment Consults called: none. All the records are reviewed and case discussed with ED provider.  Status is: Inpatient  At the time of the admission, it appears that the appropriate admission status for this patient is inpatient.  This is judged to be reasonable and necessary in order to provide the required intensity of service to ensure the patient's safety given the presenting symptoms, physical exam findings and initial radiographic and laboratory data in the context of comorbid conditions.  The patient requires inpatient status due to high intensity of service, high risk of further deterioration and high frequency of surveillance required.  I certify that at the time of admission, it is my clinical judgment that the patient will require inpatient hospital care extending more than 2 midnights.                            Dispo: The patient is from: Home              Anticipated d/c is to: Home              Patient currently is not medically stable to d/c.              Difficult to place patient: No  Madison DELENA Peaches M.D on  08/10/2024 at 5:09 PM  [facility]   From 7 PM-7 AM, contact night-coverage [email or web address]  CC: Primary care physician; [facility]     [1]  Allergies Allergen Reactions   Pollen Extract Cough   "

## 2024-08-10 NOTE — ED Triage Notes (Signed)
 Pt seen here 12/22 and diagnosed with Flu. EMS gave 2 duonebs, 125 solu-medrol . Endorses SOB, O2 89% on EMS arrival, 94% after nebs.

## 2024-08-10 NOTE — Assessment & Plan Note (Signed)
 Mildly elevated blood pressure. -Continue with home lisinopril 

## 2024-08-11 DIAGNOSIS — E785 Hyperlipidemia, unspecified: Secondary | ICD-10-CM | POA: Diagnosis not present

## 2024-08-11 DIAGNOSIS — E119 Type 2 diabetes mellitus without complications: Secondary | ICD-10-CM | POA: Diagnosis not present

## 2024-08-11 DIAGNOSIS — I10 Essential (primary) hypertension: Secondary | ICD-10-CM | POA: Diagnosis not present

## 2024-08-11 DIAGNOSIS — J441 Chronic obstructive pulmonary disease with (acute) exacerbation: Secondary | ICD-10-CM | POA: Diagnosis not present

## 2024-08-11 DIAGNOSIS — J9601 Acute respiratory failure with hypoxia: Secondary | ICD-10-CM | POA: Diagnosis not present

## 2024-08-11 LAB — BASIC METABOLIC PANEL WITH GFR
Anion gap: 10 (ref 5–15)
BUN: 7 mg/dL — ABNORMAL LOW (ref 8–23)
CO2: 33 mmol/L — ABNORMAL HIGH (ref 22–32)
Calcium: 9.7 mg/dL (ref 8.9–10.3)
Chloride: 92 mmol/L — ABNORMAL LOW (ref 98–111)
Creatinine, Ser: 0.55 mg/dL (ref 0.44–1.00)
GFR, Estimated: >60 mL/min
Glucose, Bld: 152 mg/dL — ABNORMAL HIGH (ref 70–99)
Potassium: 4.2 mmol/L (ref 3.5–5.1)
Sodium: 135 mmol/L (ref 135–145)

## 2024-08-11 LAB — CBC
HCT: 37.7 % (ref 36.0–46.0)
Hemoglobin: 13.2 g/dL (ref 12.0–15.0)
MCH: 30.3 pg (ref 26.0–34.0)
MCHC: 35 g/dL (ref 30.0–36.0)
MCV: 86.7 fL (ref 80.0–100.0)
Platelets: 281 K/uL (ref 150–400)
RBC: 4.35 MIL/uL (ref 3.87–5.11)
RDW: 11.7 % (ref 11.5–15.5)
WBC: 6.5 K/uL (ref 4.0–10.5)
nRBC: 0 % (ref 0.0–0.2)

## 2024-08-11 LAB — HIV ANTIBODY (ROUTINE TESTING W REFLEX): HIV Screen 4th Generation wRfx: NONREACTIVE

## 2024-08-11 MED ORDER — AZITHROMYCIN 250 MG PO TABS
250.0000 mg | ORAL_TABLET | Freq: Every day | ORAL | Status: DC
  Administered 2024-08-12 – 2024-08-14 (×3): 250 mg via ORAL
  Filled 2024-08-11 (×3): qty 1

## 2024-08-11 MED ORDER — IPRATROPIUM-ALBUTEROL 0.5-2.5 (3) MG/3ML IN SOLN
3.0000 mL | RESPIRATORY_TRACT | Status: DC
  Administered 2024-08-11 – 2024-08-12 (×3): 3 mL via RESPIRATORY_TRACT
  Filled 2024-08-11 (×4): qty 3

## 2024-08-11 MED ORDER — AZITHROMYCIN 500 MG PO TABS
500.0000 mg | ORAL_TABLET | Freq: Every day | ORAL | Status: AC
  Administered 2024-08-11: 500 mg via ORAL
  Filled 2024-08-11: qty 1

## 2024-08-11 MED ORDER — ENSURE PLUS HIGH PROTEIN PO LIQD
237.0000 mL | Freq: Two times a day (BID) | ORAL | Status: DC
  Administered 2024-08-11 – 2024-08-14 (×7): 237 mL via ORAL

## 2024-08-11 NOTE — Progress Notes (Signed)
" °  Progress Note   Patient: Alyssa Black FMW:[id number] DOB: [birth year] DOA: 08/10/2024     1 DOS: the patient was seen and examined on 08/11/2024   Brief hospital course: Partly taken from H&P.  Alyssa Black is a 68 y.o. African-American female with medical history significant for osteoarthritis, type 2 diabetes mellitus, pretension, dyslipidemia, ongoing tobacco abuse and TIA, presented to the emergency room with acute onset worsening dyspnea with associated cough productive of clear sputum as well as wheezing over the last week. The patient had influenza A on 08/05/2027.   On presentation mildly elevated blood pressure at 165/83, hypoxic at 87% requiring 2 L of oxygen.  Labs with hyponatremia at 131 compared to 128 on 08/04/2024, potassium 3.1, blood glucose 168, troponin 48>> 53, mild leukocytosis at 11.3 Chest x-ray with no acute abnormality.  Patient was admitted for COPD exacerbation, received steroid and bronchodilators.  12/29: Vital stable on 2 L of oxygen, leukocytosis resolved.  Still shortness of breath with increased work of breathing.  Assessment and Plan: * COPD exacerbation (HCC) Still having increased work of breathing and tight chest.  Leukocytosis improved. History of recent influenza A. - Continue ceftriaxone  and Zithromax  -Scheduled DuoNeb Q4 hourly -Continue with steroid -Continue with supportive care  Acute respiratory failure with hypoxia (HCC) Likely secondary to COPD exacerbation, currently on 2 L of oxygen with no baseline oxygen use. -Continue with supplemental oxygen-wean as tolerated  Essential hypertension Mildly elevated blood pressure. -Continue with home lisinopril   Diabetes mellitus without complication (HCC) Patient was not on any medications at home.  Most recent A1c in the system was 7.3 few years ago. - Monitor CBG-as patient is on steroid -Check A1c -We will add SSI if needed  Dyslipidemia - Will continue statin  therapy.   Subjective: Patient was still feeling very short of breath.  Quit smoking 2 weeks ago per patient.  Physical Exam: Vitals:   08/11/24 0845 08/11/24 1106 08/11/24 1500 08/11/24 1521  BP: (!) 186/118 (!) 161/96  (!) 143/75  Pulse: 95 94  83  Resp: 18 (!) 22  18  Temp: (!) 97.3 F (36.3 C) 98 F (36.7 C)  97.6 F (36.4 C)  TempSrc:      SpO2: 91% 97% 96% 100%  Weight:      Height:       General.  Ill-appearing, malnourished elderly lady, in no acute distress. Pulmonary.  Decreased breath sounds overall, increased work of breathing. CV.  Regular rate and rhythm, no JVD, rub or murmur. Abdomen.  Soft, nontender, nondistended, BS positive. CNS.  Alert and oriented .  No focal neurologic deficit. Extremities.  No edema,  pulses intact and symmetrical. Psychiatry.  Judgment and insight appears normal.   Data Reviewed: Prior data reviewed  Family Communication: Discussed with patient  Disposition: Status is: Inpatient Remains inpatient appropriate because: Severity of illness  Planned Discharge Destination: Home  DVT prophylaxis.  Lovenox  Time spent: 50 minutes  This record has been created using Conservation officer, historic buildings. Errors have been sought and corrected,but may not always be located. Such creation errors do not reflect on the standard of care.   Author: Amaryllis Dare, MD 08/11/2024 4:52 PM  For on call review [email or web address].  "

## 2024-08-11 NOTE — Hospital Course (Addendum)
 Partly taken from H&P.  Alyssa Black is a 68 y.o. African-American female with medical history significant for osteoarthritis, type 2 diabetes mellitus, pretension, dyslipidemia, ongoing tobacco abuse and TIA, presented to the emergency room with acute onset worsening dyspnea with associated cough productive of clear sputum as well as wheezing over the last week. The patient had influenza A on 08/05/2027.   On presentation mildly elevated blood pressure at 165/83, hypoxic at 87% requiring 2 L of oxygen.  Labs with hyponatremia at 131 compared to 128 on 08/04/2024, potassium 3.1, blood glucose 168, troponin 48>> 53, mild leukocytosis at 11.3 Chest x-ray with no acute abnormality.  Patient was admitted for COPD exacerbation, received steroid and bronchodilators.  12/29: Vital stable on 2 L of oxygen, leukocytosis resolved.  Still shortness of breath with increased work of breathing.

## 2024-08-11 NOTE — Progress Notes (Signed)
" °   08/11/24 1545  Spiritual Encounters  Type of Visit Follow up  Care provided to: Pt and family  Referral source Other (comment) Training And Development Officer)  Reason for visit Advance directives  OnCall Visit Yes   Chaplain responded to a page from the Unit that the patient and family wanted to discuss the AD again.  Chaplain explained the process and document to patient's son who was at the bedside.    Rev. Rana M. Nicholaus, M.Div. Chaplain Resident  [facility] "

## 2024-08-11 NOTE — Progress Notes (Signed)
 SATURATION QUALIFICATIONS: (This note is used to comply with regulatory documentation for home oxygen)  Patient Saturations on Room Air at Rest = 91  %  Patient Saturations on Room Air while Ambulating = 85 %  Patient Saturations on 2 Liters of oxygen while Ambulating = 95%  Please briefly explain why patient [id number] s home oxygen:

## 2024-08-11 NOTE — Assessment & Plan Note (Signed)
 Patient was not on any medications at home.  Most recent A1c in the system was 7.3 few years ago. - Monitor CBG-as patient is on steroid -Check A1c -We will add SSI if needed

## 2024-08-11 NOTE — Plan of Care (Signed)

## 2024-08-11 NOTE — Progress Notes (Signed)
" °   08/11/24 0930  Spiritual Encounters  Type of Visit Initial  Care provided to: Patient  Referral source Other (comment) ([facility])  Reason for visit Advance directives  OnCall Visit No   Chaplain responded to a [place] Consult in the system for an AD.  Chaplain took paperwork, explained it and left documentation.  Chaplain then engaged patient regarding health concerns, loss of independence and her strong faith.  Chaplain prayed with patient.    Rev. Rana M. Nicholaus, M.Div. Chaplain Resident  [facility] "

## 2024-08-12 DIAGNOSIS — J441 Chronic obstructive pulmonary disease with (acute) exacerbation: Secondary | ICD-10-CM | POA: Diagnosis not present

## 2024-08-12 DIAGNOSIS — E119 Type 2 diabetes mellitus without complications: Secondary | ICD-10-CM | POA: Diagnosis not present

## 2024-08-12 DIAGNOSIS — I10 Essential (primary) hypertension: Secondary | ICD-10-CM | POA: Diagnosis not present

## 2024-08-12 DIAGNOSIS — J9601 Acute respiratory failure with hypoxia: Secondary | ICD-10-CM | POA: Diagnosis not present

## 2024-08-12 LAB — RESPIRATORY PANEL BY PCR

## 2024-08-12 LAB — GLUCOSE, CAPILLARY
Glucose-Capillary: 151 mg/dL — ABNORMAL HIGH (ref 70–99)
Glucose-Capillary: 208 mg/dL — ABNORMAL HIGH (ref 70–99)

## 2024-08-12 LAB — HEMOGLOBIN A1C
Hgb A1c MFr Bld: 6.6 % — ABNORMAL HIGH (ref 4.8–5.6)
Mean Plasma Glucose: 142.72 mg/dL

## 2024-08-12 MED ORDER — LISINOPRIL 10 MG PO TABS
10.0000 mg | ORAL_TABLET | Freq: Every day | ORAL | Status: DC
  Administered 2024-08-12 – 2024-08-14 (×3): 10 mg via ORAL
  Filled 2024-08-12 (×3): qty 1

## 2024-08-12 MED ORDER — IPRATROPIUM-ALBUTEROL 0.5-2.5 (3) MG/3ML IN SOLN
3.0000 mL | Freq: Four times a day (QID) | RESPIRATORY_TRACT | Status: DC | PRN
  Administered 2024-08-12 – 2024-08-14 (×3): 3 mL via RESPIRATORY_TRACT
  Filled 2024-08-12 (×3): qty 3

## 2024-08-12 MED ORDER — IPRATROPIUM-ALBUTEROL 0.5-2.5 (3) MG/3ML IN SOLN
3.0000 mL | Freq: Three times a day (TID) | RESPIRATORY_TRACT | Status: DC
  Administered 2024-08-12: 3 mL via RESPIRATORY_TRACT
  Filled 2024-08-12: qty 3

## 2024-08-12 NOTE — Assessment & Plan Note (Signed)
 Still having increased work of breathing and tight chest.  Leukocytosis improved. History of recent influenza A. - Continue ceftriaxone  and Zithromax  -Scheduled DuoNeb Q4 hourly -Continue with steroid -Continue with supportive care

## 2024-08-12 NOTE — Assessment & Plan Note (Signed)
 On lisinopril  and hydrochlorothiazide

## 2024-08-12 NOTE — Assessment & Plan Note (Signed)
 Patient was not on any medications at home.  Most recent A1c in the system was 7.3 few years ago.  CBG currently within goal - Monitor CBG-as patient is on steroid -A1c of 6.6 -We will add SSI if needed

## 2024-08-12 NOTE — Progress Notes (Signed)
 " Progress Note   Patient: Alyssa Black FMW:[id number] DOB: 11-21-1955 DOA: 08/10/2024     2 DOS: the patient was seen and examined on 08/12/2024   Brief hospital course: Partly taken from H&P.  Alyssa Black is a 68 y.o. African-American female with medical history significant for osteoarthritis, type 2 diabetes mellitus, pretension, dyslipidemia, ongoing tobacco abuse and TIA, presented to the emergency room with acute onset worsening dyspnea with associated cough productive of clear sputum as well as wheezing over the last week. The patient had influenza A on 08/04/2024.   On presentation mildly elevated blood pressure at 165/83, hypoxic at 87% requiring 2 L of oxygen.  Labs with hyponatremia at 131 compared to 128 on 08/04/2024, potassium 3.1, blood glucose 168, troponin 48>> 53, mild leukocytosis at 11.3 Chest x-ray with no acute abnormality.  Patient was admitted for COPD exacerbation, received steroid and bronchodilators.  12/29: Vital stable on 2 L of oxygen, leukocytosis resolved.  Still shortness of breath with increased work of breathing.  12/30: Hemodynamically stable with improving air entry, currently on 1 L of oxygen.  Patient does not want to go home with oxygen if possible.  Encouraging incentive spirometry and flutter valve.  Giving her 1 more day and hoping can go home tomorrow without oxygen.  Assessment and Plan: * COPD exacerbation (HCC) Still having increased work of breathing and tight chest.  Leukocytosis improved. History of recent influenza A. - Continue ceftriaxone  and Zithromax  -Scheduled DuoNeb Q4 hourly -Continue with steroid -Continue with supportive care  Acute respiratory failure with hypoxia (HCC) Likely secondary to COPD exacerbation, currently on 2 L of oxygen with no baseline oxygen use. -Continue with supplemental oxygen-wean as tolerated  Essential hypertension Mildly elevated blood pressure. - Increasing the dose of home lisinopril  from  2.5 to 10 mg daily -Continue HCTZ  Diabetes mellitus without complication (HCC) Patient was not on any medications at home.  Most recent A1c in the system was 7.3 few years ago.  CBG currently within goal - Monitor CBG-as patient is on steroid -A1c of 6.6 -We will add SSI if needed  Dyslipidemia - Will continue statin therapy.   Subjective: Patient was sitting comfortably in chair when seen today.  Much improved breathing status and air entry.  Still on oxygen.  Does not want to go home with oxygen if possible.  Physical Exam: Vitals:   08/11/24 1938 08/12/24 0003 08/12/24 0518 08/12/24 0744  BP: (!) 141/79  (!) 159/78 (!) 152/79  Pulse: 70  75 77  Resp: 18  18 18   Temp: 98.6 F (37 C)  98 F (36.7 C) 98.2 F (36.8 C)  TempSrc:    Oral  SpO2: 98% 92% 97% 97%  Weight:      Height:       General.  Frail, malnourished lady, in no acute distress. Pulmonary.  Lungs clear bilaterally, normal respiratory effort.  Mildly decreased air entry but much improved than before. CV.  Regular rate and rhythm, no JVD, rub or murmur. Abdomen.  Soft, nontender, nondistended, BS positive. CNS.  Alert and oriented .  No focal neurologic deficit. Extremities.  No edema,  pulses intact and symmetrical. Psychiatry.  Judgment and insight appears normal.   Data Reviewed: Prior data reviewed  Family Communication: Discussed with brother at bedside  Disposition: Status is: Inpatient Remains inpatient appropriate because: Severity of illness  Planned Discharge Destination: Home  DVT prophylaxis.  Lovenox  Time spent: 45 minutes  This record has been created using  Dragon chemical engineer. Errors have been sought and corrected,but may not always be located. Such creation errors do not reflect on the standard of care.   Author: Amaryllis Dare, MD 08/12/2024 2:29 PM  For on call review [email or web address].  "

## 2024-08-12 NOTE — Plan of Care (Signed)

## 2024-08-12 NOTE — Care Management Important Message (Signed)
 Important Message  Patient Details  Name: ADILEN PAVELKO MRN: [id number] Date of Birth: 1955-09-22   Important Message Given:  Yes - Medicare IM     Ladarryl Wrage W, CMA 08/12/2024, 1:53 PM

## 2024-08-13 DIAGNOSIS — I10 Essential (primary) hypertension: Secondary | ICD-10-CM | POA: Diagnosis not present

## 2024-08-13 DIAGNOSIS — J9621 Acute and chronic respiratory failure with hypoxia: Secondary | ICD-10-CM | POA: Diagnosis not present

## 2024-08-13 DIAGNOSIS — E119 Type 2 diabetes mellitus without complications: Secondary | ICD-10-CM | POA: Diagnosis not present

## 2024-08-13 DIAGNOSIS — E785 Hyperlipidemia, unspecified: Secondary | ICD-10-CM | POA: Diagnosis not present

## 2024-08-13 DIAGNOSIS — J441 Chronic obstructive pulmonary disease with (acute) exacerbation: Secondary | ICD-10-CM | POA: Diagnosis not present

## 2024-08-13 LAB — GLUCOSE, CAPILLARY
Glucose-Capillary: 183 mg/dL — ABNORMAL HIGH (ref 70–99)
Glucose-Capillary: 122 mg/dL — ABNORMAL HIGH (ref 70–99)
Glucose-Capillary: 277 mg/dL — ABNORMAL HIGH (ref 70–99)
Glucose-Capillary: 128 mg/dL — ABNORMAL HIGH (ref 70–99)
Glucose-Capillary: 133 mg/dL — ABNORMAL HIGH (ref 70–99)

## 2024-08-13 MED ORDER — INSULIN ASPART 100 UNIT/ML IJ SOLN: 0.0000 [IU] | Freq: Every day | INTRAMUSCULAR | Status: DC

## 2024-08-13 MED ORDER — INSULIN ASPART 100 UNIT/ML IJ SOLN
0.0000 [IU] | Freq: Three times a day (TID) | INTRAMUSCULAR | Status: DC
  Administered 2024-08-13: 7 [IU] via SUBCUTANEOUS
  Administered 2024-08-13 – 2024-08-14 (×2): 1 [IU] via SUBCUTANEOUS
  Filled 2024-08-13 (×2): qty 1
  Filled 2024-08-13: qty 7

## 2024-08-13 NOTE — Assessment & Plan Note (Signed)
 Patient qualifies for home oxygen.  Will set up for home oxygen today.  Hopefully home tomorrow.

## 2024-08-13 NOTE — Plan of Care (Signed)

## 2024-08-13 NOTE — Evaluation (Signed)
 Occupational Therapy Evaluation Patient Details Name: Alyssa Black MRN: [id number] DOB: 1956-05-11 Today's Date: 08/13/2024   History of Present Illness   presented to ER secondary to progressive SOB, cough, wheezing; admitted for management of acute respiratory failure with hypoxia related to COPD exacerbation     Clinical Impressions Pt was seen for OT evaluation this date. Prior to hospital admission, pt was independent but does endorse becoming more fatigued with exertional activities. Pt lives by herself but reports friends and family can assist PRN. Pt presents with deficits in activity tolerance and need for acute O2 limiting their ability to perform ADL management at baseline level. Pt currently requires CGA-SBA for ADL transfers/mobility, set up and supv for seated ADL, and increased time/effort to complete exertional tasks such as bathing, LB dressing, and IADL such as housekeeping and meal prep. Pt edu in ECS with handout provided to maximize recall and carryover - including AE/DME, PLB, IS use, flutter valve use, home/routines modifications, and activity pacing. Pt verbalized understanding. Pt would benefit from skilled OT services to address noted impairments and functional limitations (see below for any additional details) in order to maximize safety and independence while minimizing future risk of falls, injury, and readmission. Anticipate pt will benefit from follow up OT services upon acute hospital DC.    If plan is discharge home, recommend the following:   Assistance with cooking/housework;Assist for transportation;Help with stairs or ramp for entrance;A little help with bathing/dressing/bathroom;A little help with walking and/or transfers     Functional Status Assessment   Patient has had a recent decline in their functional status and demonstrates the ability to make significant improvements in function in a reasonable and predictable amount of time.     Equipment  Recommendations   Other (comment) (consider 4WRW, reacher, pulse oximeter)     Recommendations for Other Services         Precautions/Restrictions   Precautions Precautions: Fall Recall of Precautions/Restrictions: Intact Restrictions Weight Bearing Restrictions Per Provider Order: No     Mobility Bed Mobility Overal bed mobility: Modified Independent                  Transfers Overall transfer level: Needs assistance   Transfers: Sit to/from Stand Sit to Stand: Contact guard assist, Supervision                  Balance Overall balance assessment: Needs assistance Sitting-balance support: No upper extremity supported, Feet supported Sitting balance-Leahy Scale: Good     Standing balance support: No upper extremity supported Standing balance-Leahy Scale: Fair                             ADL either performed or assessed with clinical judgement   ADL                                         General ADL Comments: supv-CGA for ADL transfers/mobility, increased effort/time to complete more exertional tasks including bathing and IADL.     Vision         Perception         Praxis         Pertinent Vitals/Pain Pain Assessment Pain Assessment: No/denies pain     Extremity/Trunk Assessment Upper Extremity Assessment Upper Extremity Assessment: Overall WFL for tasks assessed   Lower Extremity Assessment Lower  Extremity Assessment: Overall WFL for tasks assessed       Communication Communication Communication: No apparent difficulties   Cognition Arousal: Alert Behavior During Therapy: WFL for tasks assessed/performed                                 Following commands: Intact       Cueing  General Comments   Cueing Techniques: Verbal cues;Gestural cues;Tactile cues      Exercises Other Exercises Other Exercises: Pt edu in ECS with handout provided to maximize recall and carryover -  including AE/DME, PLB, IS use, flutter valve use, home/routines modifications, and activity pacing.   Shoulder Instructions      Home Living Family/patient expects to be discharged to:: Private residence Living Arrangements: Alone Available Help at Discharge: Family;Friend(s);Available PRN/intermittently Type of Home: House Home Access: Stairs to enter [employer] of Steps: 3-4   Home Layout: One level     Bathroom Shower/Tub: Tub/shower unit;Sponge bathes at baseline   [employer]: Handicapped height     Home Equipment: None          Prior Functioning/Environment Prior Level of Function : Independent/Modified Independent;Driving               ADLs Comments: has some friends that can help out but was doing it all by herself before    OT Problem List: Decreased activity tolerance;Decreased knowledge of use of DME or AE   OT Treatment/Interventions: Self-care/ADL training;Therapeutic activities;Energy conservation;DME and/or AE instruction;Patient/family education      OT Goals(Current goals can be found in the care plan section)   Acute Rehab OT Goals Patient Stated Goal: go home and be independent OT Goal Formulation: With patient Time For Goal Achievement: 08/27/24 Potential to Achieve Goals: Good ADL Goals Additional ADL Goal #1: Pt will complete all aspects of bathing, primarily in sitting, with mod indep, 1/1. Additional ADL Goal #2: Pt will verbalize plan to implement at least 2 learned ECS to maximize safety/indep   OT Frequency:  Min 1X/week    Co-evaluation              AM-PAC OT 6 Clicks Daily Activity     Outcome Measure Help from another person eating meals?: None Help from another person taking care of personal grooming?: None Help from another person toileting, which includes using toliet, bedpan, or urinal?: A Little Help from another person bathing (including washing, rinsing, drying)?: A Little Help from another  person to put on and taking off regular upper body clothing?: None Help from another person to put on and taking off regular lower body clothing?: A Little 6 Click Score: 21   End of Session Equipment Utilized During Treatment: Oxygen  Activity Tolerance: Patient tolerated treatment well Patient left: in bed;with call bell/phone within reach;with nursing/sitter in room  OT Visit Diagnosis: Other abnormalities of gait and mobility (R26.89)                Time: 8391-8368 OT Time Calculation (min): 23 min Charges:  OT General Charges $OT Visit: 1 Visit OT Evaluation $OT Eval Low Complexity: 1 Low OT Treatments $Self Care/Home Management : 8-22 mins  Warren SAUNDERS., MPH, MS, OTR/L ascom [phone] 08/13/2024, 4:44 PM

## 2024-08-13 NOTE — TOC Initial Note (Signed)
 Transition of Care [facility]) - Initial/Assessment Note    Patient Details  Name: Alyssa Black MRN: [id number] Date of Birth: November 05, 1955  [facility]) CM/SW Contact:    Grayce JAYSON Perfect, RN Phone Number: 08/13/2024, 11:43 AM  Clinical Narrative:                  Patient requiring acute oxygen while hospitalized.  CM will continue to monitor if patient needs oxygen set up in home.  No OT/PT needs at this time.  Patient has PCP.  Will return home once discharged from hospital.   Patient Goals and CMS Choice            Expected Discharge Plan and Services                                              Prior Living Arrangements/Services                       Activities of Daily Living   ADL Screening (condition at time of admission) Independently performs ADLs?: Yes (appropriate for developmental age) Is the patient deaf or have difficulty hearing?: No Does the patient have difficulty seeing, even when wearing glasses/contacts?: No Does the patient have difficulty concentrating, remembering, or making decisions?: Yes  Permission Sought/Granted                  Emotional Assessment              Admission diagnosis:  Hyperlipidemia [E78.5] COPD exacerbation (HCC) [J44.1] Essential hypertension [I10] Influenza [J11.1] Patient Active Problem List   Diagnosis Date Noted   Diabetes mellitus without complication (HCC)    COPD exacerbation (HCC) 08/10/2024   Acute respiratory failure with hypoxia (HCC) 08/10/2024   Essential hypertension 08/10/2024   Dyslipidemia 08/10/2024   History of colonic polyps    Special screening for malignant neoplasms, colon    Benign neoplasm of ascending colon    Benign neoplasm of descending colon    Polyp of sigmoid colon    Knee pain 07/18/2016   Degeneration of lumbar intervertebral disc 07/18/2016   Low back pain 07/18/2016   Arthritis 11/10/2015   Hypertension 09/21/2011   Hyperlipidemia  09/21/2011   Chronic neck pain 09/21/2011   PCP:  [facility]:   [facility] [address]), [place] - [address] [place] JAC[place]) [place] [address] Phone: [phone] Fax: [phone]     Social Drivers of Health (SDOH) Social History: SDOH Screenings   Food Insecurity: No Food Insecurity (08/11/2024)  Housing: Low Risk (08/11/2024)  Transportation Needs: No Transportation Needs (08/11/2024)  Utilities: Not At Risk (08/11/2024)  Social Connections: Moderately Integrated (08/11/2024)  Tobacco Use: High Risk (08/10/2024)   SDOH Interventions:     Readmission Risk Interventions     No data to display

## 2024-08-13 NOTE — Evaluation (Signed)
 Physical Therapy Evaluation Patient Details Name: Alyssa Black MRN: [id number] DOB: 1955/10/04 Today's Date: 08/13/2024  History of Present Illness  presented to ER secondary to progressive SOB, cough, wheezing; admitted for management of acute respiratory failure with hypoxia related to COPD exacerbation  Clinical Impression  Patient seated in recliner upon arrival to room; alert and oriented, follows commands and agreeable to participation with treatment session.  Denies pain; does endorse improvement in respiratory status since admission.  Hopeful to wean from O2. Bilat UE/LE strength and ROM grossly symmetrical and WFL; no focal weakness appreciated.  Able to complete sit/stand, basic transfers and gait (50') without assist device, cga/close sup.  Demonstrates reciprocal stepping pattern with fair step height/length; decreased cadence with guarded trunk posturing, limited arm swing. Significant SOB with gait efforts (BORG 8/10), desat to 80% on RA requiring seated rest break and reapplication of supplemental O2 for recovery >90%.  Additional 33' without assist device, cga/close sup--improved activity tolerance, respiratory support/tolerance with use of supplemental O2 (maintaining sats >92% with gait on 2L).  Patient left on 2L end of session; RN/MD informed and aware. Would benefit from skilled PT to address above deficits and promote optimal return to PLOF.; recommend post-acute PT follow up as indicated by interdisciplinary care team.     SaO2 on room air at rest = 89% SaO2 on room air while ambulating = 80% SaO2 on 2 liters of O2 while ambulating = 93%        If plan is discharge home, recommend the following: A little help with walking and/or transfers;A little help with bathing/dressing/bathroom   Can travel by private vehicle        Equipment Recommendations Rollator (4 wheels)  Recommendations for Other Services       Functional Status Assessment Patient has had a  recent decline in their functional status and demonstrates the ability to make significant improvements in function in a reasonable and predictable amount of time.     Precautions / Restrictions Precautions Precautions: Fall Restrictions Weight Bearing Restrictions Per Provider Order: No      Mobility  Bed Mobility               General bed mobility comments: seated in recliner beginning/end of session    Transfers Overall transfer level: Needs assistance   Transfers: Sit to/from Stand Sit to Stand: Contact guard assist, Supervision                Ambulation/Gait Ambulation/Gait assistance: Contact guard assist, Supervision Gait Distance (Feet): 50 Feet Assistive device: None         General Gait Details: reciprocal stepping pattern with fair step height/length; decreased cadence with guarded trunk posturing, limited arm swing.  Significant SOB with gait efforts (BORG 8/10), desat to 80% on RA requiring seated rest break and reapplication of supplemental O2 for recovery >90%.  Stairs            Wheelchair Mobility     Tilt Bed    Modified Rankin (Stroke Patients Only)       Balance Overall balance assessment: Needs assistance Sitting-balance support: No upper extremity supported, Feet supported Sitting balance-Leahy Scale: Good     Standing balance support: No upper extremity supported Standing balance-Leahy Scale: Fair                               Pertinent Vitals/Pain Pain Assessment Pain Assessment: No/denies pain    Home  Living Family/patient expects to be discharged to:: Private residence Living Arrangements: Alone   Type of Home: House Home Access: Stairs to enter   Secretary/administrator of Steps: 3-4   Home Layout: One level Home Equipment: None      Prior Function Prior Level of Function : Independent/Modified Independent;Driving                     Extremity/Trunk Assessment   Upper Extremity  Assessment Upper Extremity Assessment: Overall WFL for tasks assessed    Lower Extremity Assessment Lower Extremity Assessment: Overall WFL for tasks assessed       Communication   Communication Communication: No apparent difficulties    Cognition Arousal: Alert Behavior During Therapy: WFL for tasks assessed/performed   PT - Cognitive impairments: No apparent impairments                         Following commands: Intact       Cueing Cueing Techniques: Verbal cues, Gestural cues, Tactile cues     General Comments      Exercises Other Exercises Other Exercises: 21' without assist device, cga/close sup--improved activity tolerance, respiratory support/tolerance with use of supplemental O2 (maintaining sats >92% with gait on 2L) Other Exercises: Toilet transfer, ambulatory without assist device, cga/close sup; sit/stand from standard toilet, cga/close sup; standing balance for hygiene, clothing managemnt, cga/close sup Other Exercises: Reviewed home safety modifications, strategies for activity pacing/energy conservation, O2 management; patient voiced understanding of all information.   Assessment/Plan    PT Assessment Patient needs continued PT services  PT Problem List Decreased activity tolerance;Decreased balance;Decreased mobility;Decreased knowledge of use of DME;Decreased safety awareness;Decreased knowledge of precautions;Cardiopulmonary status limiting activity       PT Treatment Interventions DME instruction;Gait training;Stair training;Functional mobility training;Therapeutic activities;Therapeutic exercise;Balance training;Patient/family education    PT Goals (Current goals can be found in the Care Plan section)  Acute Rehab PT Goals Patient Stated Goal: to go home tomorrow PT Goal Formulation: With patient/family Time For Goal Achievement: 08/27/24 Potential to Achieve Goals: Good    Frequency Min 2X/week     Co-evaluation                AM-PAC PT 6 Clicks Mobility  Outcome Measure Help needed turning from your back to your side while in a flat bed without using bedrails?: None Help needed moving from lying on your back to sitting on the side of a flat bed without using bedrails?: None Help needed moving to and from a bed to a chair (including a wheelchair)?: None Help needed standing up from a chair using your arms (e.g., wheelchair or bedside chair)?: None Help needed to walk in hospital room?: None Help needed climbing 3-5 steps with a railing? : A Little 6 Click Score: 23    End of Session   Activity Tolerance: Patient tolerated treatment well Patient left: in chair;with call bell/phone within reach;with chair alarm set;with family/visitor present Nurse Communication: Mobility status PT Visit Diagnosis: Muscle weakness (generalized) (M62.81);Difficulty in walking, not elsewhere classified (R26.2)    Time: 1001-1035 PT Time Calculation (min) (ACUTE ONLY): 34 min   Charges:   PT Evaluation $PT Eval Moderate Complexity: 1 Mod PT Treatments $Therapeutic Activity: 8-22 mins PT General Charges $$ ACUTE PT VISIT: 1 Visit        Shaughnessy Gethers H. Delores, PT, DPT, NCS 08/13/2024, 2:26 PM [phone]

## 2024-08-13 NOTE — Assessment & Plan Note (Addendum)
 On simvastatin .

## 2024-08-13 NOTE — TOC CM/SW Note (Signed)
 RNCM contacted Alyssa Black at [facility] for oxygen referral and order for rollater.  Probable discharge 08/14/24.  Referrals sent to Homecare agencies today.

## 2024-08-13 NOTE — Progress Notes (Signed)
 " Progress Note   Patient: Alyssa Black FMW:[id number] DOB: 1956-02-20 DOA: 08/10/2024     3 DOS: the patient was seen and examined on 08/13/2024   Brief hospital course: Partly taken from H&P.  Alyssa Black is a 68 y.o. African-American female with medical history significant for osteoarthritis, type 2 diabetes mellitus, pretension, dyslipidemia, ongoing tobacco abuse and TIA, presented to the emergency room with acute onset worsening dyspnea with associated cough productive of clear sputum as well as wheezing over the last week. The patient had influenza A on 08/04/2024.   On presentation mildly elevated blood pressure at 165/83, hypoxic at 87% requiring 2 L of oxygen.  Labs with hyponatremia at 131 compared to 128 on 08/04/2024, potassium 3.1, blood glucose 168, troponin 48>> 53, mild leukocytosis at 11.3 Chest x-ray with no acute abnormality.  Patient was admitted for COPD exacerbation, received steroid and bronchodilators.  12/29: Vital stable on 2 L of oxygen, leukocytosis resolved.  Still shortness of breath with increased work of breathing.  12/30: Hemodynamically stable with improving air entry, currently on 1 L of oxygen.  Patient does not want to go home with oxygen if possible.  Encouraging incentive spirometry and flutter valve. 12/31.  Patient qualifies for home oxygen 2 L.  Will have transitional care team try to set up today.  Hopefully will be able to go home tomorrow  Assessment and Plan: * COPD exacerbation (HCC) Still having increased work of breathing and tight chest.  Leukocytosis improved. History of recent influenza A (08/04/2024). - Continue ceftriaxone  and Zithromax  - Continue DuoNeb and prednisone   Acute on chronic respiratory failure with hypoxia [facility]) Patient qualifies for home oxygen.  Will set up for home oxygen today.  Hopefully home tomorrow.  Essential hypertension On lisinopril  and hydrochlorothiazide   Diabetes mellitus without complication  (HCC) Patient was not on any medications at home.  Most recent A1c in the system was 7.3 few years ago.  CBG currently within goal - Monitor CBG-as patient is on steroid -A1c of 6.6 - Adding sliding scale  Dyslipidemia On simvastatin         Subjective: Patient complains of some shortness of breath.  Some cough.  Mated with COPD exacerbation.  Physical Exam: Vitals:   08/12/24 2122 08/13/24 0445 08/13/24 0754 08/13/24 0839  BP: (!) 158/77 (!) 165/89 (!) 169/93   Pulse: 64 79 85   Resp: 19 17 16    Temp: 97.8 F (36.6 C) 98.7 F (37.1 C) 97.7 F (36.5 C)   TempSrc:      SpO2: 100% 100% 98% 90%  Weight:      Height:       Physical Exam HENT:     Head: Normocephalic.     Mouth/Throat:     Pharynx: No oropharyngeal exudate.  Eyes:     General: Lids are normal.  Cardiovascular:     Rate and Rhythm: Normal rate and regular rhythm.     Heart sounds: Normal heart sounds and S1 normal.  Pulmonary:     Breath sounds: Examination of the right-lower field reveals decreased breath sounds. Examination of the left-lower field reveals decreased breath sounds. Decreased breath sounds present. No wheezing, rhonchi or rales.  Abdominal:     Palpations: Abdomen is soft.     Tenderness: There is no abdominal tenderness.  Musculoskeletal:     Right lower leg: No swelling.     Left lower leg: No swelling.  Skin:    General: Skin is warm.  Findings: No rash.  Neurological:     Mental Status: She is alert and oriented to person, place, and time.     Data Reviewed: Sugar 277  Family Communication: Family at bedside  Disposition: Status is: Inpatient Remains inpatient appropriate because: Patient qualifies for home oxygen  Planned Discharge Destination: Home    Time spent: 28 minutes  Author: Charlie Patterson, MD 08/13/2024 12:28 PM  For on call review [email or web address].  "

## 2024-08-13 NOTE — TOC Initial Note (Deleted)
 Transition of Care [facility]) - Initial/Assessment Note    Patient Details  Name: Alyssa Black MRN: [id number] Date of Birth: 01-13-1956  [facility] CM/SW Contact:    Grayce JAYSON Perfect, RN Phone Number: 08/13/2024, 11:52 AM  Clinical Narrative:                         Patient Goals and CMS Choice            Expected Discharge Plan and Services                                              Prior Living Arrangements/Services                       Activities of Daily Living   ADL Screening (condition at time of admission) Independently performs ADLs?: Yes (appropriate for developmental age) Is the patient deaf or have difficulty hearing?: No Does the patient have difficulty seeing, even when wearing glasses/contacts?: No Does the patient have difficulty concentrating, remembering, or making decisions?: Yes  Permission Sought/Granted                  Emotional Assessment              Admission diagnosis:  Hyperlipidemia [E78.5] COPD exacerbation (HCC) [J44.1] Essential hypertension [I10] Influenza [J11.1] Patient Active Problem List   Diagnosis Date Noted   Diabetes mellitus without complication (HCC)    COPD exacerbation (HCC) 08/10/2024   Acute respiratory failure with hypoxia (HCC) 08/10/2024   Essential hypertension 08/10/2024   Dyslipidemia 08/10/2024   History of colonic polyps    Special screening for malignant neoplasms, colon    Benign neoplasm of ascending colon    Benign neoplasm of descending colon    Polyp of sigmoid colon    Knee pain 07/18/2016   Degeneration of lumbar intervertebral disc 07/18/2016   Low back pain 07/18/2016   Arthritis 11/10/2015   Hypertension 09/21/2011   Hyperlipidemia 09/21/2011   Chronic neck pain 09/21/2011   PCP:  [facility]:   [facility] [address] ([address]), [place] - [address]  [place]) [place] [address] Phone: [phone] Fax: [phone]     Social Drivers of Health (SDOH) Social History: SDOH Screenings   Food Insecurity: No Food Insecurity (08/11/2024)  Housing: Low Risk (08/11/2024)  Transportation Needs: No Transportation Needs (08/11/2024)  Utilities: Not At Risk (08/11/2024)  Social Connections: Moderately Integrated (08/11/2024)  Tobacco Use: High Risk (08/10/2024)   SDOH Interventions:     Readmission Risk Interventions     No data to display

## 2024-08-13 NOTE — Plan of Care (Signed)

## 2024-08-14 DIAGNOSIS — E119 Type 2 diabetes mellitus without complications: Secondary | ICD-10-CM | POA: Diagnosis not present

## 2024-08-14 DIAGNOSIS — J9621 Acute and chronic respiratory failure with hypoxia: Secondary | ICD-10-CM | POA: Diagnosis not present

## 2024-08-14 DIAGNOSIS — J441 Chronic obstructive pulmonary disease with (acute) exacerbation: Secondary | ICD-10-CM | POA: Diagnosis not present

## 2024-08-14 DIAGNOSIS — I10 Essential (primary) hypertension: Secondary | ICD-10-CM | POA: Diagnosis not present

## 2024-08-14 LAB — GLUCOSE, CAPILLARY: Glucose-Capillary: 128 mg/dL — ABNORMAL HIGH (ref 70–99)

## 2024-08-14 MED ORDER — ALBUTEROL SULFATE HFA 108 (90 BASE) MCG/ACT IN AERS: 2.0000 | INHALATION_SPRAY | Freq: Four times a day (QID) | RESPIRATORY_TRACT | 0 refills | Status: AC | PRN

## 2024-08-14 MED ORDER — LISINOPRIL 10 MG PO TABS: 10.0000 mg | ORAL_TABLET | Freq: Every day | ORAL | 0 refills | Status: AC

## 2024-08-14 MED ORDER — AMOXICILLIN-POT CLAVULANATE 875-125 MG PO TABS: 1.0000 | ORAL_TABLET | Freq: Two times a day (BID) | ORAL | 0 refills | Status: AC

## 2024-08-14 MED ORDER — ENSURE PLUS HIGH PROTEIN PO LIQD: 237.0000 mL | Freq: Two times a day (BID) | ORAL | 0 refills | Status: AC

## 2024-08-14 MED ORDER — AZITHROMYCIN 250 MG PO TABS: ORAL_TABLET | ORAL | 0 refills | Status: AC

## 2024-08-14 MED ORDER — BUDESONIDE-FORMOTEROL FUMARATE 80-4.5 MCG/ACT IN AERO: 2.0000 | INHALATION_SPRAY | Freq: Two times a day (BID) | RESPIRATORY_TRACT | 0 refills | Status: AC

## 2024-08-14 NOTE — Progress Notes (Addendum)
 9048 D/C AVS completed and reviewed with pt. All opportunities for questions answered and clarified. IV removed. Pt will be wheeled down to car at medical mall entrance via wheelchair.  9047 Called ride [place] (niece) Left VM no answer. Will attempt again  1015 Niece at bedside reviewed d/c paperwork

## 2024-08-14 NOTE — Discharge Summary (Signed)
 " Physician Discharge Summary   Patient: Alyssa Black MRN: [id number] DOB: [birth year]  Admit date:     08/10/2024  Discharge date: 08/14/2024  Discharge Physician: Charlie Patterson   PCP: [facility]   Recommendations at discharge:   Follow-up PCP 5 days Refer to pulmonology Home health  Discharge Diagnoses: Principal Problem:   COPD exacerbation (HCC) Active Problems:   Acute on chronic respiratory failure with hypoxia (HCC)   Essential hypertension   Diabetes mellitus without complication (HCC)   Dyslipidemia  Resolved Problems:   * No resolved hospital problems. [facility] Course: Partly taken from H&P.  Alyssa Black is a 69 y.o. African-American female with medical history significant for osteoarthritis, type 2 diabetes mellitus, pretension, dyslipidemia, ongoing tobacco abuse and TIA, presented to the emergency room with acute onset worsening dyspnea with associated cough productive of clear sputum as well as wheezing over the last week. The patient had influenza A on 08/04/2024.   On presentation mildly elevated blood pressure at 165/83, hypoxic at 87% requiring 2 L of oxygen.  Labs with hyponatremia at 131 compared to 128 on 08/04/2024, potassium 3.1, blood glucose 168, troponin 48>> 53, mild leukocytosis at 11.3 Chest x-ray with no acute abnormality.  Patient was admitted for COPD exacerbation, received steroid and bronchodilators.  12/29: Vital stable on 2 L of oxygen, leukocytosis resolved.  Still shortness of breath with increased work of breathing.  12/30: Hemodynamically stable with improving air entry, currently on 1 L of oxygen.  Patient does not want to go home with oxygen if possible.  Encouraging incentive spirometry and flutter valve. 12/31.  Patient qualifies for home oxygen 2 L.  Will have transitional care team try to set up today.  Hopefully will be able to go home tomorrow 1/1.  Patient completed prednisone  here will  complete antibiotics upon going home.  Assessment and Plan: * COPD exacerbation (HCC) Respiratory status improved during hospital course.  Leukocytosis improved. History of recent influenza A (08/04/2024). - Received Rocephin  and Zithromax  here.  Switch over to Augmentin and Zithromax  upon discharge for completion of course.  Completed prednisone  here.  Prescribed albuterol  inhaler and Symbicort.  Acute on chronic respiratory failure with hypoxia (HCC) Patient qualifies for home oxygen with oxygen saturation yesterday with ambulation down to 80%.  Held saturations on 2 L.  Will refer to pulmonology as outpatient.  Essential hypertension On lisinopril  and hydrochlorothiazide   Diabetes mellitus without complication (HCC) Patient was not on any medications at home.  Diet controlled.  Sugar slightly elevated with steroids here. -A1c of 6.6   Dyslipidemia On simvastatin          Consultants: None Procedures performed: None Disposition: Home health Diet recommendation:  Cardiac and Carb modified diet DISCHARGE MEDICATION: Allergies as of 08/14/2024       Reactions   Pollen Extract Cough        Medication List     STOP taking these medications    cyclobenzaprine  10 MG tablet Commonly known as: FLEXERIL    diphenhydrAMINE 25 MG tablet Commonly known as: BENADRYL   ibuprofen 800 MG tablet Commonly known as: ADVIL   naproxen  500 MG tablet Commonly known as: NAPROSYN    oxymetazoline  0.05 % nasal spray Commonly known as: AFRIN       TAKE these medications    albuterol  108 (90 Base) MCG/ACT inhaler Commonly known as: VENTOLIN  HFA Inhale 2 puffs into the lungs every 6 (six) hours as needed for wheezing or shortness of  breath.   amoxicillin-clavulanate 875-125 MG tablet Commonly known as: AUGMENTIN Take 1 tablet by mouth 2 (two) times daily for 3 doses.   azithromycin  250 MG tablet Commonly known as: ZITHROMAX  One tab po daily for 2 days Start taking on:  August 15, 2024   budesonide-formoterol 80-4.5 MCG/ACT inhaler Commonly known as: Symbicort Inhale 2 puffs into the lungs 2 (two) times daily. Rinse mouth out with water  after using   cetirizine 10 MG tablet Commonly known as: ZYRTEC Take 10 mg by mouth 2 (two) times daily.   feeding supplement Liqd Take 237 mLs by mouth 2 (two) times daily between meals.   hydrochlorothiazide  25 MG tablet Commonly known as: HYDRODIURIL  Take 1 tablet (25 mg total) by mouth daily.   lisinopril  10 MG tablet Commonly known as: ZESTRIL  Take 1 tablet (10 mg total) by mouth daily. Start taking on: August 15, 2024 What changed:  medication strength how much to take Another medication with the same name was removed. Continue taking this medication, and follow the directions you see here.   simvastatin  20 MG tablet Commonly known as: ZOCOR  Take 1 tablet (20 mg total) by mouth at bedtime.        Follow-up Information     [facility] Follow up in 5 day(s).   Specialty: General Practice Contact information: [address] [place] [address] [phone]         Parris Manna, MD Follow up in 2 week(s).   Specialty: Pulmonary Disease Contact information: [address] [place] [address] [phone]                Discharge Exam: Alyssa Black   08/10/24 1341 08/11/24 0008  Weight: 68.9 kg 52.6 kg   Physical Exam HENT:     Head: Normocephalic.     Mouth/Throat:     Pharynx: No oropharyngeal exudate.  Eyes:     General: Lids are normal.  Cardiovascular:     Rate and Rhythm: Normal rate and regular rhythm.     Heart sounds: Normal heart sounds and S1 normal.  Pulmonary:     Breath sounds: Examination of the right-lower field reveals decreased breath sounds. Examination of the left-lower field reveals decreased breath sounds. Decreased breath sounds present. No wheezing, rhonchi or rales.  Abdominal:     Palpations:  Abdomen is soft.     Tenderness: There is no abdominal tenderness.  Musculoskeletal:     Right lower leg: No swelling.     Left lower leg: No swelling.  Skin:    General: Skin is warm.     Findings: No rash.  Neurological:     Mental Status: She is alert and oriented to person, place, and time.      Condition at discharge: stable  The results of significant diagnostics from this hospitalization (including imaging, microbiology, ancillary and laboratory) are listed below for reference.   Imaging Studies: DG Chest 2 View Result Date: 08/10/2024 CLINICAL DATA:  Recently diagnosed with the flu presenting with shortness of breath. EXAM: CHEST - 2 VIEW COMPARISON:  August 04, 2024 FINDINGS: The heart size and mediastinal contours are within normal limits. Both lungs are clear. The visualized skeletal structures are unremarkable. IMPRESSION: No active cardiopulmonary disease. Electronically Signed   By: Suzen Dials M.D.   On: 08/10/2024 15:12   DG Chest 2 View Result Date: 08/04/2024 EXAM: 2 VIEW(S) XRAY OF THE CHEST 08/04/2024 05:14:00 PM COMPARISON: None available. CLINICAL HISTORY: sob  FINDINGS: LUNGS AND PLEURA: Hyperinflated lungs due to emphysema. No focal pulmonary opacity. No pleural effusion. No pneumothorax. HEART AND MEDIASTINUM: No acute abnormality of the cardiac and mediastinal silhouettes. BONES AND SOFT TISSUES: Scattered thoracic osteophytosis. IMPRESSION: 1. Emphysema. No acute cardiopulmonary abnormality. Electronically signed by: Rogelia Myers MD 08/04/2024 05:49 PM EST RP Workstation: [id number]    Microbiology: Results for orders placed or performed during the hospital encounter of 08/10/24  Respiratory (~20 pathogens) panel by PCR     Status: None   Collection Time: 08/11/24  4:50 PM   Specimen: Nasopharyngeal Swab; Respiratory  Result Value Ref Range Status   Adenovirus NOT DETECTED NOT DETECTED Final   Coronavirus 229E NOT DETECTED NOT DETECTED Final     Comment: (NOTE) The Coronavirus on the Respiratory Panel, DOES NOT test for the novel  Coronavirus (2019 nCoV)    Coronavirus HKU1 NOT DETECTED NOT DETECTED Final   Coronavirus NL63 NOT DETECTED NOT DETECTED Final   Coronavirus OC43 NOT DETECTED NOT DETECTED Final   Metapneumovirus NOT DETECTED NOT DETECTED Final   Rhinovirus / Enterovirus NOT DETECTED NOT DETECTED Final   Influenza A NOT DETECTED NOT DETECTED Final   Influenza B NOT DETECTED NOT DETECTED Final   Parainfluenza Virus 1 NOT DETECTED NOT DETECTED Final   Parainfluenza Virus 2 NOT DETECTED NOT DETECTED Final   Parainfluenza Virus 3 NOT DETECTED NOT DETECTED Final   Parainfluenza Virus 4 NOT DETECTED NOT DETECTED Final   Respiratory Syncytial Virus NOT DETECTED NOT DETECTED Final   Bordetella pertussis NOT DETECTED NOT DETECTED Final   Bordetella Parapertussis NOT DETECTED NOT DETECTED Final   Chlamydophila pneumoniae NOT DETECTED NOT DETECTED Final   Mycoplasma pneumoniae NOT DETECTED NOT DETECTED Final    Comment: Performed at [facility], [address], [place], [place] [address]    Labs: CBC: Recent Labs  Lab 08/10/24 1345 08/11/24 0739  WBC 11.3* 6.5  HGB 13.9 13.2  HCT 39.2 37.7  MCV 85.8 86.7  PLT 249 281   Basic Metabolic Panel: Recent Labs  Lab 08/10/24 1345 08/11/24 0739  NA 131* 135  K 3.1* 4.2  CL 90* 92*  CO2 28 33*  GLUCOSE 160* 152*  BUN 5* 7*  CREATININE 0.51 0.55  CALCIUM 9.1 9.7  MG 2.1  --    Liver Function Tests: No results for input(s): AST, ALT, ALKPHOS, BILITOT, PROT, ALBUMIN in the last 168 hours. CBG: Recent Labs  Lab 08/13/24 0624 08/13/24 1133 08/13/24 1632 08/13/24 2202 08/14/24 0738  GLUCAP 122* 277* 128* 133* 128*    Discharge time spent: greater than 30 minutes.  Signed: Charlie Patterson, MD [facility] 08/14/2024 "

## 2024-08-14 NOTE — TOC Progression Note (Addendum)
 Transition of Care [facility]) - Progression Note    Patient Details  Name: Alyssa Black MRN: [id number] Date of Birth: [birth year]  [facility] CM/SW Contact  Alyssa JONETTA Hamilton, RN Phone Number: 08/14/2024, 8:45 AM  Clinical Narrative:     Spoke to patient, introduced self, presented agency offers for [facility]. Patient verbalized to please call her niece Alyssa Black [phone] and let her decide. This CM lvmm for Gala to return my call.   UPDATE 10:25am:  Met with patient and her Niece Alyssa, presented agency offers, Alyssa choice is [facility]. Alyssa will transport patient home.   [facility] selected in [place], [place] notified.   Expected Discharge Plan: Home w Home Health Services Barriers to Discharge: Continued Medical Work up               Expected Discharge Plan and Services   Discharge Planning Services: CM Consult Post Acute Care Choice: Home Health Living arrangements for the past 2 months: Single Family Home Expected Discharge Date: 08/14/24               DME Arranged: Oxygen DME Agency: [employer] Date DME Agency Contacted: 08/13/24   Representative spoke with at DME Agency: [facility] HH Arranged: PT, OT           Social Drivers of Health (SDOH) Interventions SDOH Screenings   Food Insecurity: No Food Insecurity (08/11/2024)  Housing: Low Risk (08/11/2024)  Transportation Needs: No Transportation Needs (08/11/2024)  Utilities: Not At Risk (08/11/2024)  Social Connections: Moderately Integrated (08/11/2024)  Tobacco Use: High Risk (08/10/2024)    Readmission Risk Interventions     No data to display

## 2024-08-27 ENCOUNTER — Ambulatory Visit
Admission: RE | Admit: 2024-08-27 | Discharge: 2024-08-27 | Disposition: A | Discharge status: Home / Self Care | Source: Ambulatory Visit | Attending: Acute Care | Admitting: Acute Care

## 2024-08-27 DIAGNOSIS — Z122 Encounter for screening for malignant neoplasm of respiratory organs: Secondary | ICD-10-CM | POA: Diagnosis not present

## 2024-08-27 DIAGNOSIS — F17210 Nicotine dependence, cigarettes, uncomplicated: Secondary | ICD-10-CM | POA: Insufficient documentation

## 2024-08-27 DIAGNOSIS — R911 Solitary pulmonary nodule: Secondary | ICD-10-CM | POA: Insufficient documentation

## 2024-08-27 DIAGNOSIS — Z87891 Personal history of nicotine dependence: Secondary | ICD-10-CM

## 2024-09-03 ENCOUNTER — Other Ambulatory Visit: Payer: Self-pay

## 2024-09-03 DIAGNOSIS — Z122 Encounter for screening for malignant neoplasm of respiratory organs: Secondary | ICD-10-CM

## 2024-09-03 DIAGNOSIS — Z87891 Personal history of nicotine dependence: Secondary | ICD-10-CM
